# Patient Record
Sex: Female | Born: 1935 | Race: White | Hispanic: No | Smoking: Former smoker
Health system: Southern US, Community
[De-identification: ages and names within clinical notes are randomized; demographics above are authoritative.]

## PROBLEM LIST (undated history)

## (undated) DIAGNOSIS — M810 Age-related osteoporosis without current pathological fracture: Secondary | ICD-10-CM

## (undated) DIAGNOSIS — I639 Cerebral infarction, unspecified: Secondary | ICD-10-CM

## (undated) DIAGNOSIS — N183 Chronic kidney disease, stage 3 unspecified: Secondary | ICD-10-CM

## (undated) DIAGNOSIS — Z9981 Dependence on supplemental oxygen: Secondary | ICD-10-CM

## (undated) DIAGNOSIS — R279 Unspecified lack of coordination: Secondary | ICD-10-CM

## (undated) DIAGNOSIS — Z9289 Personal history of other medical treatment: Secondary | ICD-10-CM

## (undated) DIAGNOSIS — R269 Unspecified abnormalities of gait and mobility: Secondary | ICD-10-CM

## (undated) DIAGNOSIS — I4891 Unspecified atrial fibrillation: Secondary | ICD-10-CM

## (undated) DIAGNOSIS — I739 Peripheral vascular disease, unspecified: Secondary | ICD-10-CM

## (undated) DIAGNOSIS — R296 Repeated falls: Secondary | ICD-10-CM

## (undated) DIAGNOSIS — F32A Depression, unspecified: Secondary | ICD-10-CM

## (undated) DIAGNOSIS — Z66 Do not resuscitate: Secondary | ICD-10-CM

## (undated) DIAGNOSIS — K635 Polyp of colon: Secondary | ICD-10-CM

## (undated) DIAGNOSIS — I509 Heart failure, unspecified: Secondary | ICD-10-CM

## (undated) DIAGNOSIS — Z8673 Personal history of transient ischemic attack (TIA), and cerebral infarction without residual deficits: Secondary | ICD-10-CM

## (undated) DIAGNOSIS — W19XXXA Unspecified fall, initial encounter: Secondary | ICD-10-CM

## (undated) DIAGNOSIS — K222 Esophageal obstruction: Secondary | ICD-10-CM

## (undated) DIAGNOSIS — M5430 Sciatica, unspecified side: Secondary | ICD-10-CM

## (undated) DIAGNOSIS — IMO0002 Reserved for concepts with insufficient information to code with codable children: Secondary | ICD-10-CM

## (undated) DIAGNOSIS — R262 Difficulty in walking, not elsewhere classified: Secondary | ICD-10-CM

## (undated) DIAGNOSIS — M5417 Radiculopathy, lumbosacral region: Secondary | ICD-10-CM

## (undated) DIAGNOSIS — J449 Chronic obstructive pulmonary disease, unspecified: Secondary | ICD-10-CM

## (undated) DIAGNOSIS — R627 Adult failure to thrive: Secondary | ICD-10-CM

## (undated) DIAGNOSIS — K219 Gastro-esophageal reflux disease without esophagitis: Secondary | ICD-10-CM

## (undated) DIAGNOSIS — M549 Dorsalgia, unspecified: Secondary | ICD-10-CM

## (undated) DIAGNOSIS — I2510 Atherosclerotic heart disease of native coronary artery without angina pectoris: Secondary | ICD-10-CM

## (undated) DIAGNOSIS — F329 Major depressive disorder, single episode, unspecified: Secondary | ICD-10-CM

## (undated) DIAGNOSIS — S2249XA Multiple fractures of ribs, unspecified side, initial encounter for closed fracture: Secondary | ICD-10-CM

## (undated) DIAGNOSIS — M7650 Patellar tendinitis, unspecified knee: Secondary | ICD-10-CM

## (undated) DIAGNOSIS — M6281 Muscle weakness (generalized): Secondary | ICD-10-CM

## (undated) DIAGNOSIS — I6529 Occlusion and stenosis of unspecified carotid artery: Secondary | ICD-10-CM

## (undated) DIAGNOSIS — K5730 Diverticulosis of large intestine without perforation or abscess without bleeding: Secondary | ICD-10-CM

## (undated) DIAGNOSIS — N390 Urinary tract infection, site not specified: Secondary | ICD-10-CM

## (undated) DIAGNOSIS — M4800 Spinal stenosis, site unspecified: Secondary | ICD-10-CM

## (undated) DIAGNOSIS — E876 Hypokalemia: Secondary | ICD-10-CM

## (undated) DIAGNOSIS — I10 Essential (primary) hypertension: Secondary | ICD-10-CM

## (undated) DIAGNOSIS — M8448XA Pathological fracture, other site, initial encounter for fracture: Secondary | ICD-10-CM

## (undated) DIAGNOSIS — S2239XA Fracture of one rib, unspecified side, initial encounter for closed fracture: Secondary | ICD-10-CM

## (undated) HISTORY — DX: Polyp of colon: K63.5

## (undated) HISTORY — PX: ANGIOPLASTY: SHX39

## (undated) HISTORY — DX: Personal history of transient ischemic attack (TIA), and cerebral infarction without residual deficits: Z86.73

## (undated) HISTORY — DX: Atherosclerotic heart disease of native coronary artery without angina pectoris: I25.10

## (undated) HISTORY — DX: Essential (primary) hypertension: I10

## (undated) HISTORY — DX: Occlusion and stenosis of unspecified carotid artery: I65.29

## (undated) HISTORY — PX: APPENDECTOMY: SHX54

## (undated) HISTORY — DX: Esophageal obstruction: K22.2

## (undated) HISTORY — DX: Peripheral vascular disease, unspecified: I73.9

## (undated) HISTORY — DX: Chronic obstructive pulmonary disease, unspecified: J44.9

## (undated) HISTORY — DX: Chronic kidney disease, stage 3 unspecified: N18.30

## (undated) HISTORY — DX: Chronic kidney disease, stage 3 (moderate): N18.3

## (undated) HISTORY — DX: Do not resuscitate: Z66

## (undated) HISTORY — DX: Personal history of other medical treatment: Z92.89

## (undated) HISTORY — PX: BACK SURGERY: SHX140

## (undated) HISTORY — DX: Heart failure, unspecified: I50.9

## (undated) HISTORY — DX: Diverticulosis of large intestine without perforation or abscess without bleeding: K57.30

## (undated) HISTORY — PX: ABDOMINAL HYSTERECTOMY: SHX81

## (undated) HISTORY — DX: Unspecified atrial fibrillation: I48.91

---

## 1998-04-30 ENCOUNTER — Emergency Department (HOSPITAL_COMMUNITY): Admission: EM | Admit: 1998-04-30 | Discharge: 1998-04-30 | Payer: Self-pay | Admitting: Emergency Medicine

## 1998-05-17 ENCOUNTER — Ambulatory Visit (HOSPITAL_COMMUNITY): Admission: RE | Admit: 1998-05-17 | Discharge: 1998-05-17 | Payer: Self-pay | Admitting: Gastroenterology

## 1998-05-25 ENCOUNTER — Ambulatory Visit (HOSPITAL_COMMUNITY): Admission: RE | Admit: 1998-05-25 | Discharge: 1998-05-25 | Payer: Self-pay | Admitting: Gastroenterology

## 1998-06-12 ENCOUNTER — Emergency Department (HOSPITAL_COMMUNITY): Admission: EM | Admit: 1998-06-12 | Discharge: 1998-06-12 | Payer: Self-pay | Admitting: Emergency Medicine

## 1998-07-05 ENCOUNTER — Ambulatory Visit (HOSPITAL_COMMUNITY): Admission: RE | Admit: 1998-07-05 | Discharge: 1998-07-05 | Payer: Self-pay | Admitting: Neurosurgery

## 1998-07-20 ENCOUNTER — Encounter: Payer: Self-pay | Admitting: Neurosurgery

## 1998-07-20 ENCOUNTER — Ambulatory Visit (HOSPITAL_COMMUNITY): Admission: RE | Admit: 1998-07-20 | Discharge: 1998-07-20 | Payer: Self-pay | Admitting: Neurosurgery

## 1998-08-03 ENCOUNTER — Ambulatory Visit (HOSPITAL_COMMUNITY): Admission: RE | Admit: 1998-08-03 | Discharge: 1998-08-03 | Payer: Self-pay | Admitting: Urology

## 1999-03-24 ENCOUNTER — Inpatient Hospital Stay (HOSPITAL_COMMUNITY): Admission: EM | Admit: 1999-03-24 | Discharge: 1999-03-25 | Payer: Self-pay | Admitting: Emergency Medicine

## 1999-03-24 ENCOUNTER — Encounter: Payer: Self-pay | Admitting: Emergency Medicine

## 1999-09-15 ENCOUNTER — Ambulatory Visit (HOSPITAL_COMMUNITY): Admission: RE | Admit: 1999-09-15 | Discharge: 1999-09-15 | Payer: Self-pay | Admitting: Internal Medicine

## 1999-09-15 ENCOUNTER — Encounter (INDEPENDENT_AMBULATORY_CARE_PROVIDER_SITE_OTHER): Payer: Self-pay | Admitting: Specialist

## 1999-09-22 ENCOUNTER — Encounter: Payer: Self-pay | Admitting: Internal Medicine

## 1999-09-22 ENCOUNTER — Ambulatory Visit (HOSPITAL_COMMUNITY): Admission: RE | Admit: 1999-09-22 | Discharge: 1999-09-22 | Payer: Self-pay | Admitting: Internal Medicine

## 1999-11-11 ENCOUNTER — Ambulatory Visit (HOSPITAL_COMMUNITY): Admission: RE | Admit: 1999-11-11 | Discharge: 1999-11-11 | Payer: Self-pay | Admitting: Pulmonary Disease

## 2000-01-30 ENCOUNTER — Other Ambulatory Visit: Admission: RE | Admit: 2000-01-30 | Discharge: 2000-01-30 | Payer: Self-pay | Admitting: Family Medicine

## 2000-03-01 ENCOUNTER — Ambulatory Visit: Admission: RE | Admit: 2000-03-01 | Discharge: 2000-03-01 | Payer: Self-pay | Admitting: Pulmonary Disease

## 2000-04-03 ENCOUNTER — Encounter (HOSPITAL_COMMUNITY): Admission: RE | Admit: 2000-04-03 | Discharge: 2000-07-02 | Payer: Self-pay | Admitting: Pulmonary Disease

## 2000-10-24 ENCOUNTER — Encounter: Payer: Self-pay | Admitting: Emergency Medicine

## 2000-10-24 ENCOUNTER — Inpatient Hospital Stay (HOSPITAL_COMMUNITY): Admission: EM | Admit: 2000-10-24 | Discharge: 2000-10-27 | Payer: Self-pay | Admitting: Emergency Medicine

## 2001-03-21 ENCOUNTER — Other Ambulatory Visit: Admission: RE | Admit: 2001-03-21 | Discharge: 2001-03-21 | Payer: Self-pay | Admitting: Family Medicine

## 2001-10-08 ENCOUNTER — Encounter: Admission: RE | Admit: 2001-10-08 | Discharge: 2001-10-08 | Payer: Self-pay | Admitting: *Deleted

## 2002-08-19 ENCOUNTER — Observation Stay (HOSPITAL_COMMUNITY): Admission: RE | Admit: 2002-08-19 | Discharge: 2002-08-20 | Payer: Self-pay | Admitting: Urology

## 2002-11-25 ENCOUNTER — Emergency Department (HOSPITAL_COMMUNITY): Admission: EM | Admit: 2002-11-25 | Discharge: 2002-11-25 | Payer: Self-pay | Admitting: Emergency Medicine

## 2002-12-25 ENCOUNTER — Encounter: Payer: Self-pay | Admitting: Emergency Medicine

## 2002-12-25 ENCOUNTER — Inpatient Hospital Stay (HOSPITAL_COMMUNITY): Admission: EM | Admit: 2002-12-25 | Discharge: 2003-01-01 | Payer: Self-pay | Admitting: Emergency Medicine

## 2002-12-27 ENCOUNTER — Encounter: Payer: Self-pay | Admitting: Cardiology

## 2002-12-31 ENCOUNTER — Encounter: Payer: Self-pay | Admitting: Cardiology

## 2004-03-18 ENCOUNTER — Encounter: Admission: RE | Admit: 2004-03-18 | Discharge: 2004-03-18 | Payer: Self-pay | Admitting: Orthopedic Surgery

## 2004-03-21 ENCOUNTER — Ambulatory Visit (HOSPITAL_COMMUNITY): Admission: RE | Admit: 2004-03-21 | Discharge: 2004-03-21 | Payer: Self-pay | Admitting: Orthopedic Surgery

## 2004-03-21 ENCOUNTER — Ambulatory Visit (HOSPITAL_BASED_OUTPATIENT_CLINIC_OR_DEPARTMENT_OTHER): Admission: RE | Admit: 2004-03-21 | Discharge: 2004-03-21 | Payer: Self-pay | Admitting: Orthopedic Surgery

## 2004-04-19 ENCOUNTER — Ambulatory Visit (HOSPITAL_COMMUNITY): Admission: RE | Admit: 2004-04-19 | Discharge: 2004-04-19 | Payer: Self-pay | Admitting: Internal Medicine

## 2004-06-25 ENCOUNTER — Inpatient Hospital Stay (HOSPITAL_COMMUNITY): Admission: EM | Admit: 2004-06-25 | Discharge: 2004-06-28 | Payer: Self-pay | Admitting: Emergency Medicine

## 2004-09-11 ENCOUNTER — Emergency Department (HOSPITAL_COMMUNITY): Admission: EM | Admit: 2004-09-11 | Discharge: 2004-09-11 | Payer: Self-pay | Admitting: Emergency Medicine

## 2004-10-03 ENCOUNTER — Emergency Department (HOSPITAL_COMMUNITY): Admission: EM | Admit: 2004-10-03 | Discharge: 2004-10-03 | Payer: Self-pay

## 2004-10-07 ENCOUNTER — Ambulatory Visit: Payer: Self-pay | Admitting: Cardiology

## 2004-10-07 ENCOUNTER — Ambulatory Visit: Payer: Self-pay | Admitting: Internal Medicine

## 2004-10-07 ENCOUNTER — Inpatient Hospital Stay (HOSPITAL_COMMUNITY): Admission: EM | Admit: 2004-10-07 | Discharge: 2004-10-15 | Payer: Self-pay | Admitting: Emergency Medicine

## 2004-10-10 ENCOUNTER — Encounter: Payer: Self-pay | Admitting: Cardiology

## 2004-10-13 ENCOUNTER — Ambulatory Visit: Payer: Self-pay | Admitting: Internal Medicine

## 2004-12-20 ENCOUNTER — Ambulatory Visit: Payer: Self-pay | Admitting: Family Medicine

## 2005-01-12 ENCOUNTER — Ambulatory Visit: Payer: Self-pay | Admitting: Family Medicine

## 2005-01-31 ENCOUNTER — Ambulatory Visit: Payer: Self-pay | Admitting: Family Medicine

## 2005-04-04 ENCOUNTER — Ambulatory Visit: Payer: Self-pay | Admitting: Family Medicine

## 2005-04-05 IMAGING — CR DG ELBOW COMPLETE 3+V*L*
3 series · 3 of 3 positions shown · non-contrast
Comparison: None.

CLINICAL DATA: Fall with pain.  
 LEFT ELBOW 10/03/04:

[view not recorded (1 of 3)]
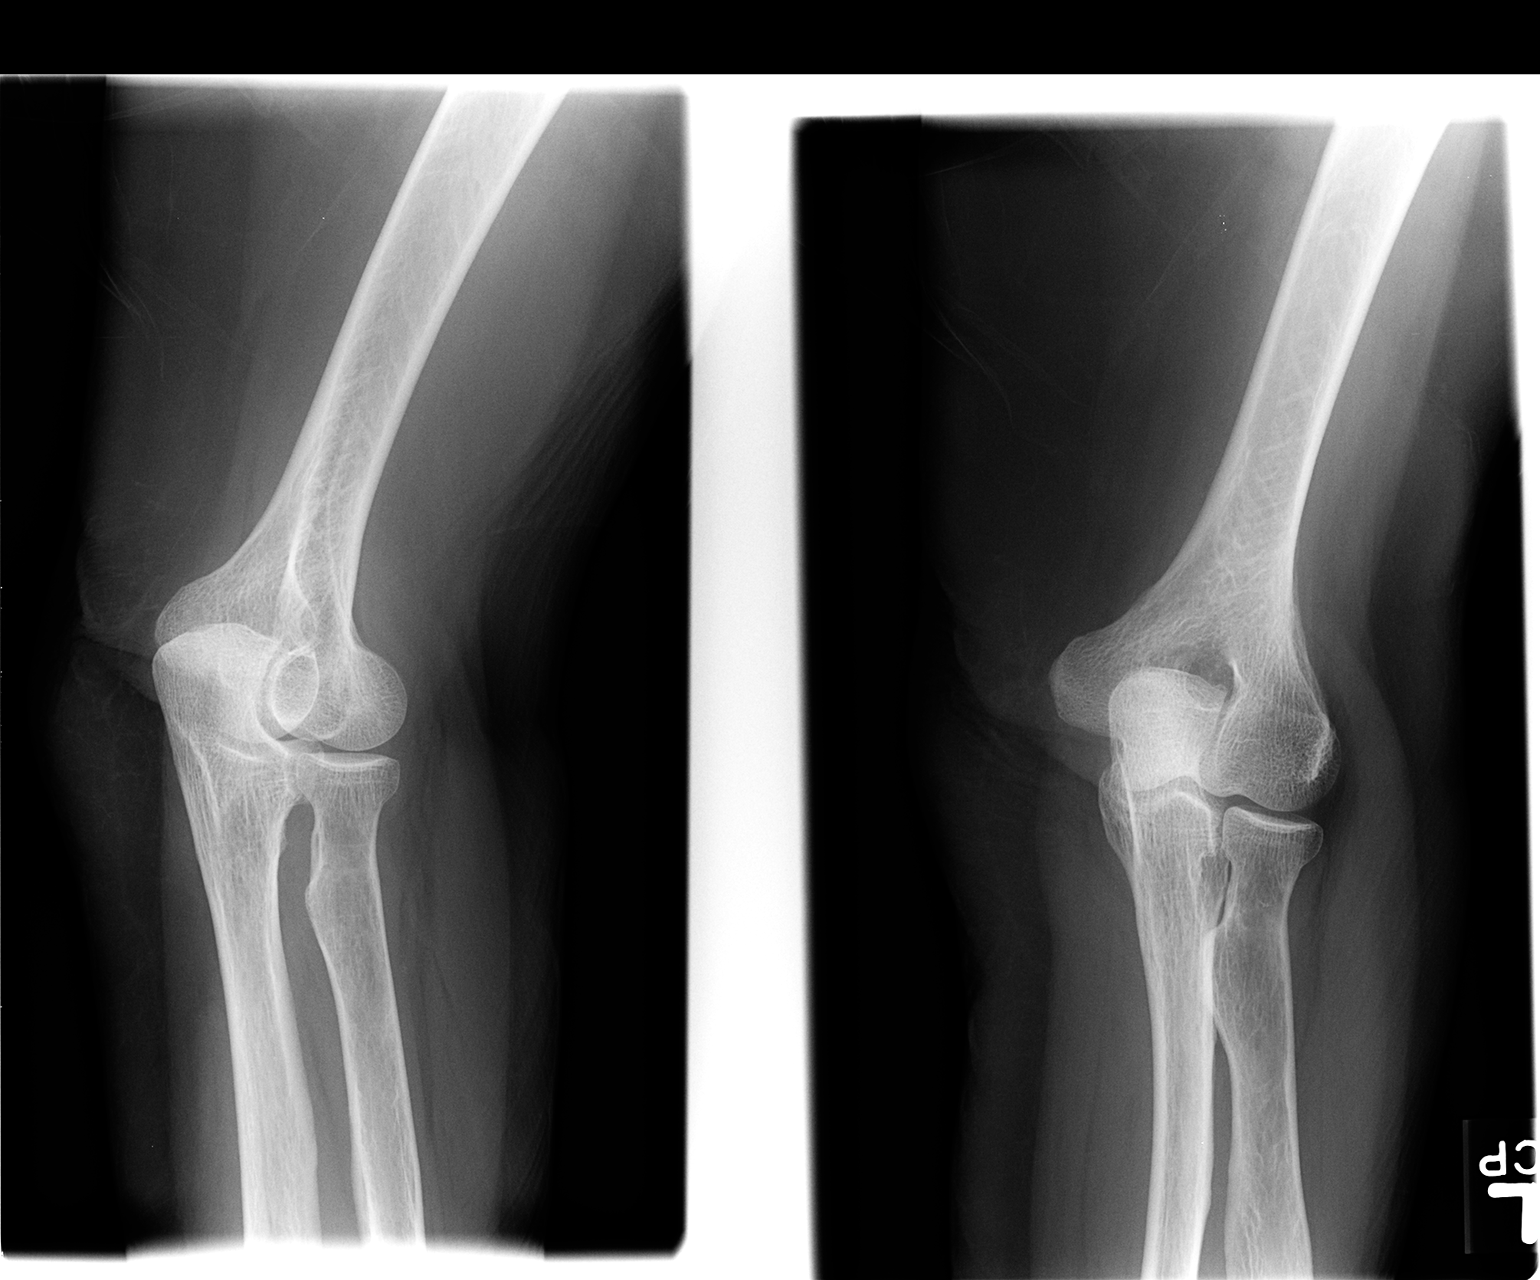

[view not recorded (2 of 3)]
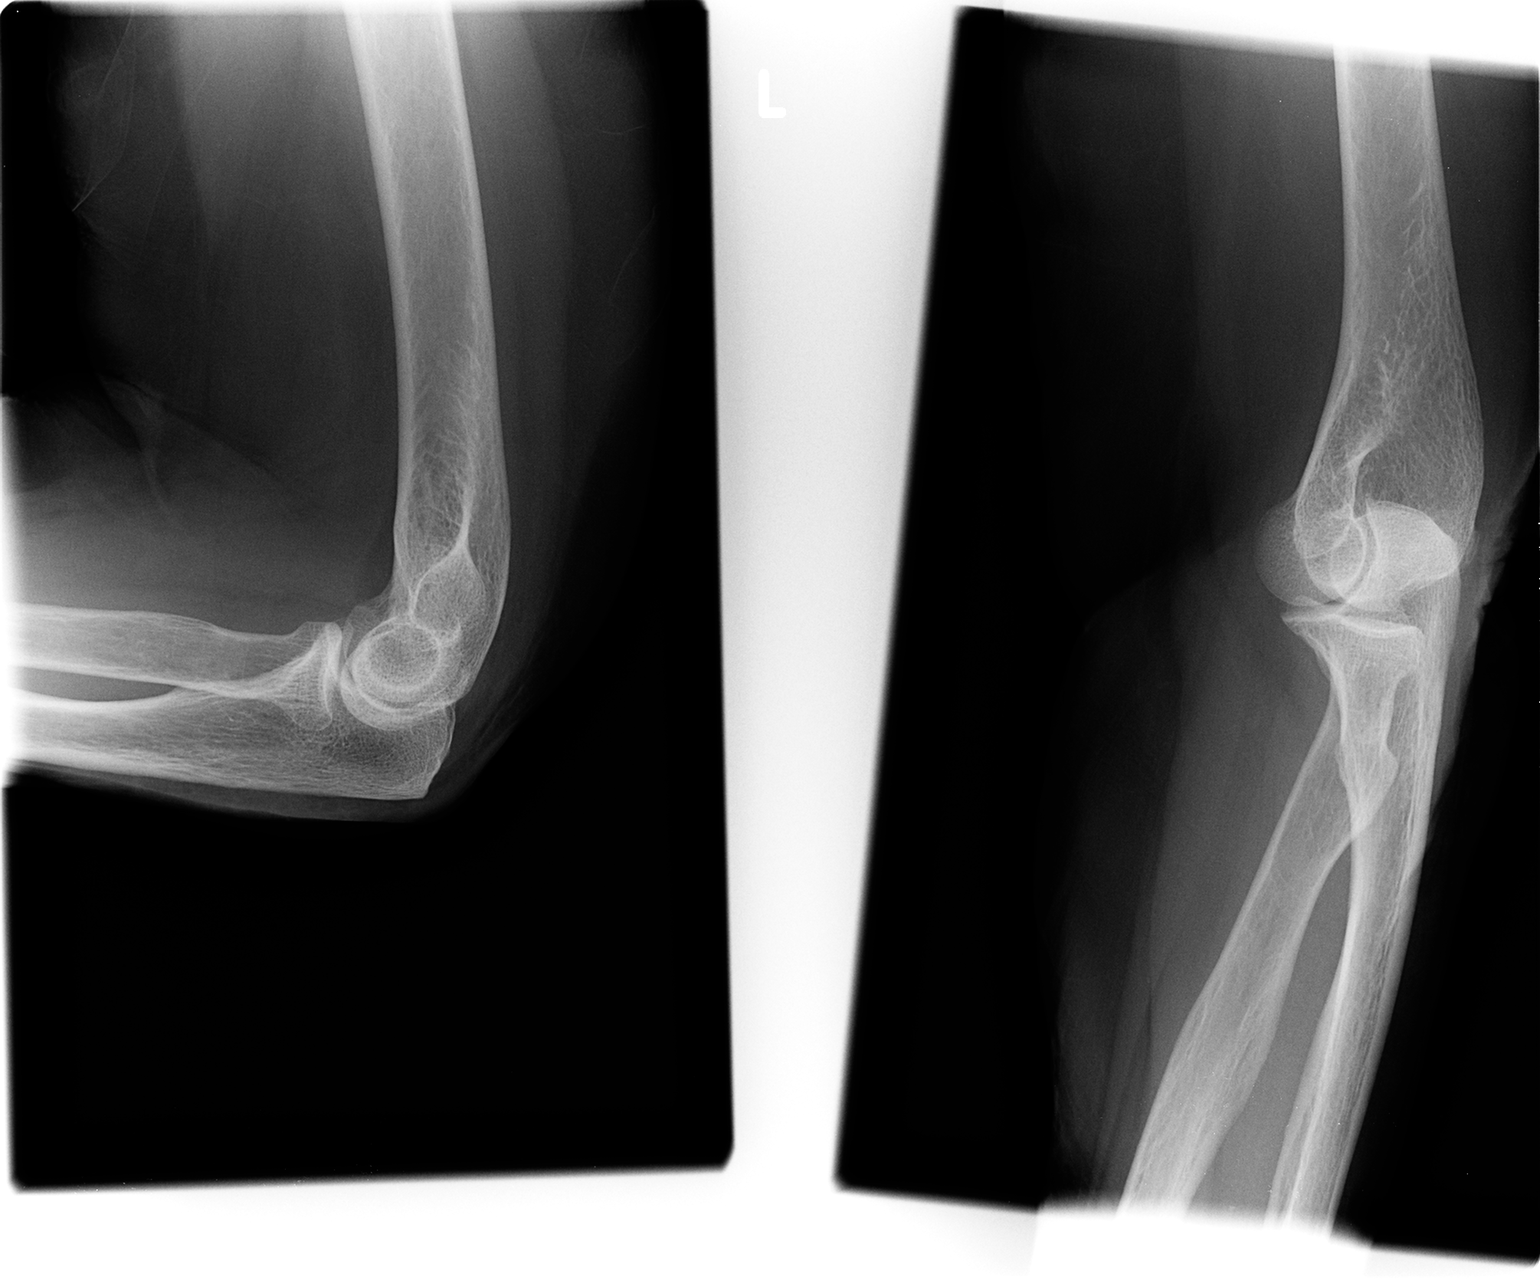

[view not recorded (3 of 3)]
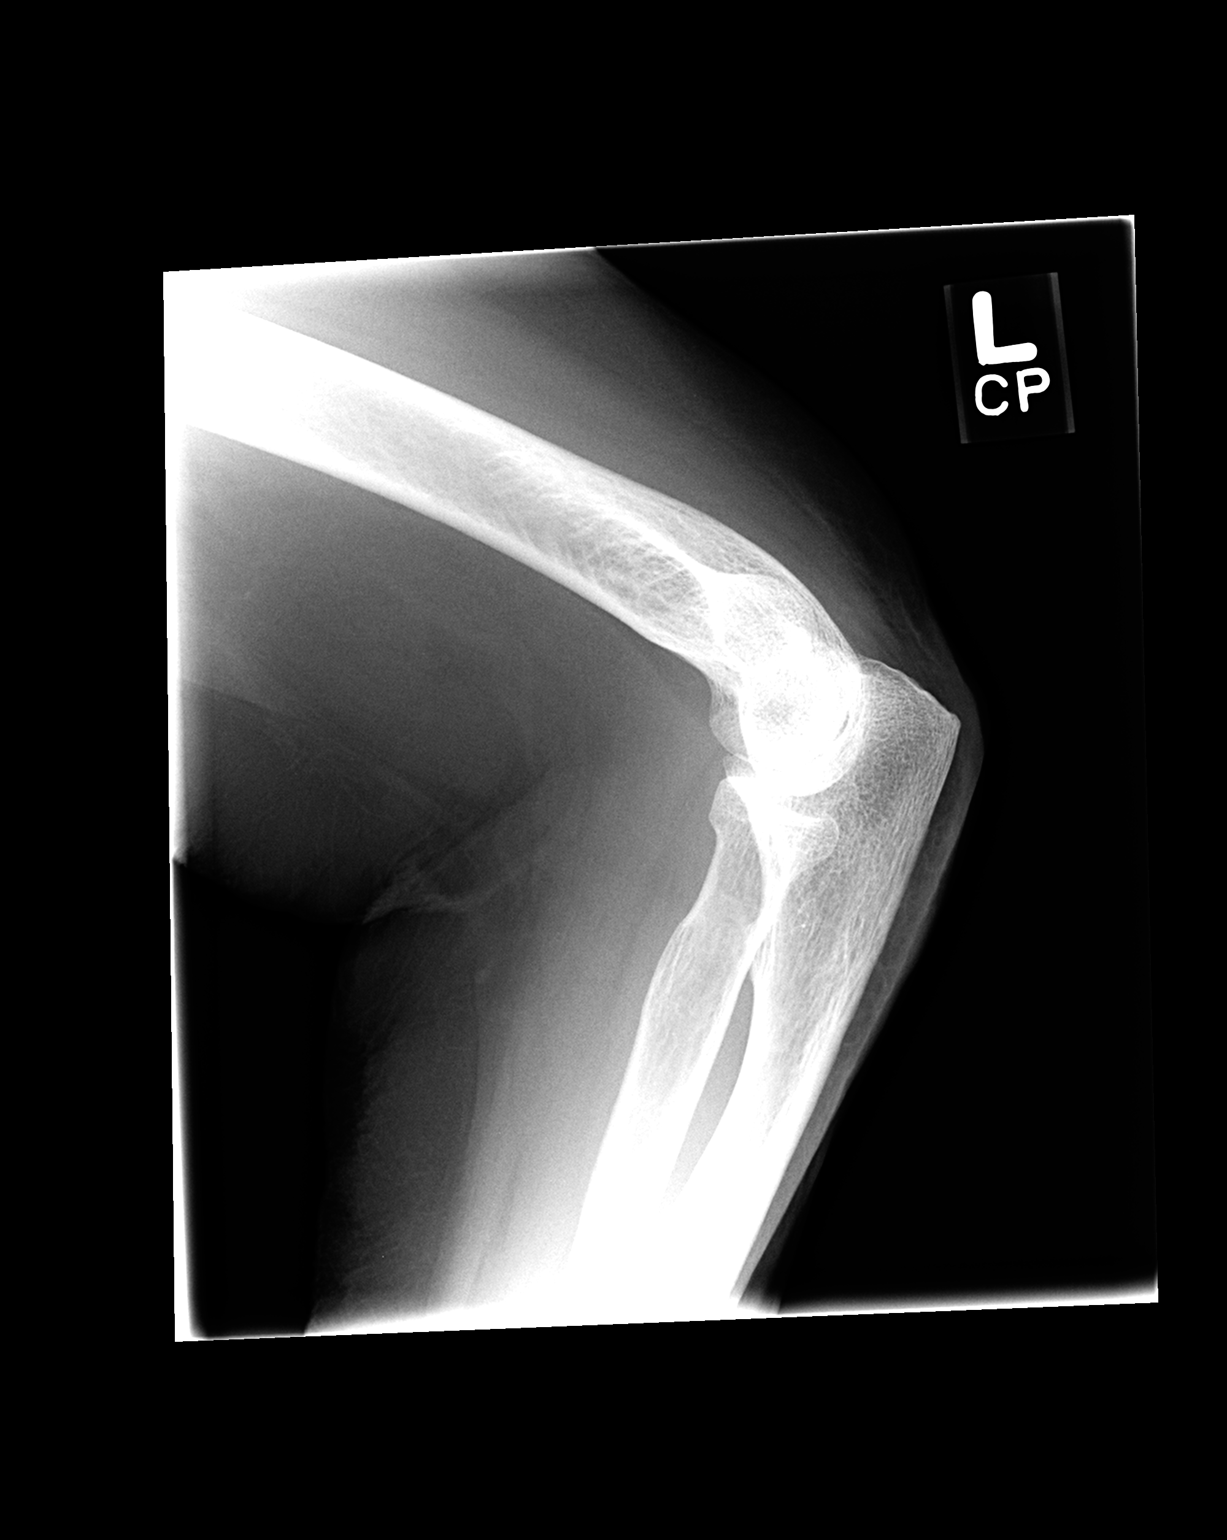

[3 of 3 positions shown; findings below may reference images not displayed]

FINDINGS: Four view exam left elbow shows no acute fracture or dislocation.  There is no elevation of either fat pad.  The overlying soft tissues are unremarkable.
IMPRESSION: No acute bony abnormality.

## 2005-04-09 IMAGING — CR DG THORACIC SPINE 2V
3 series · 3 of 3 positions shown · non-contrast
Comparison: None.
COMPARISON: None.

CLINICAL DATA: Fell, pain in back, pelvis and right hip.  
 THORACIC SPINE ? TWO VIEWS:

[view not recorded (1 of 3)]
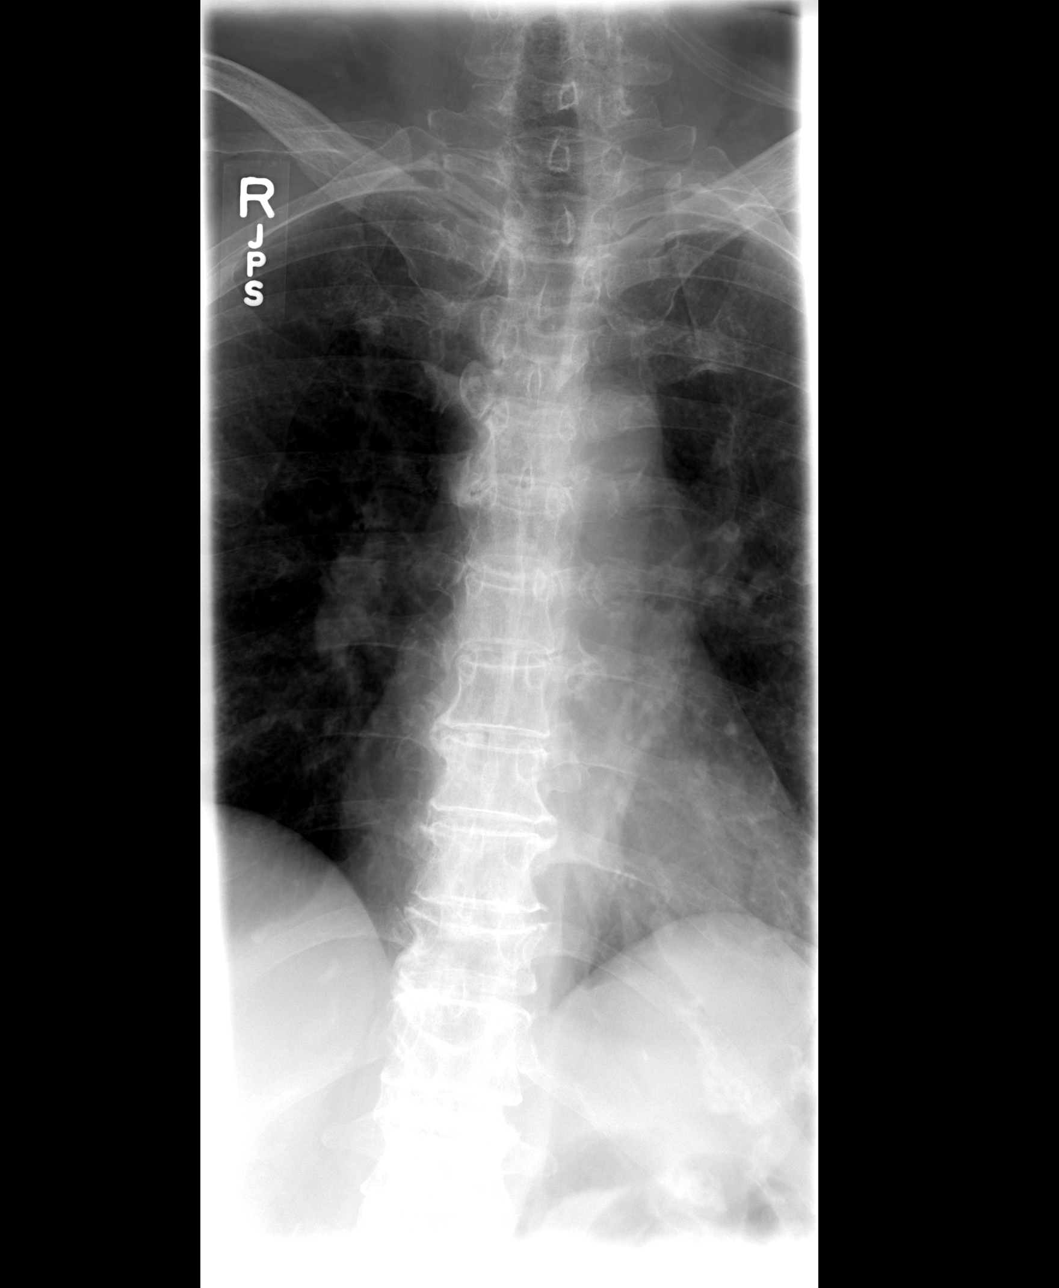

[view not recorded (2 of 3)]
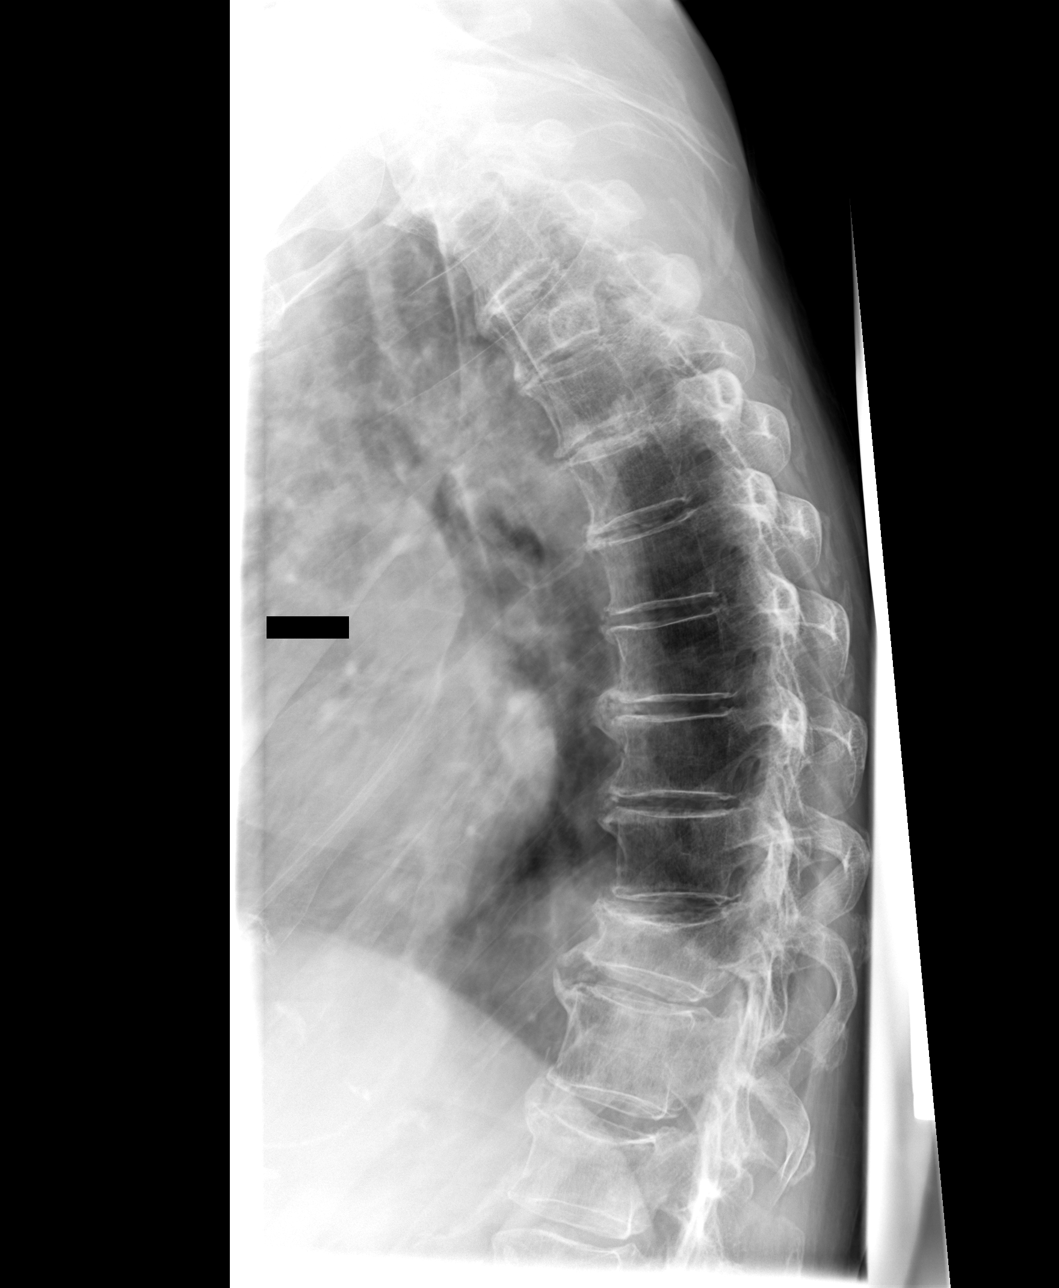

[view not recorded (3 of 3)]
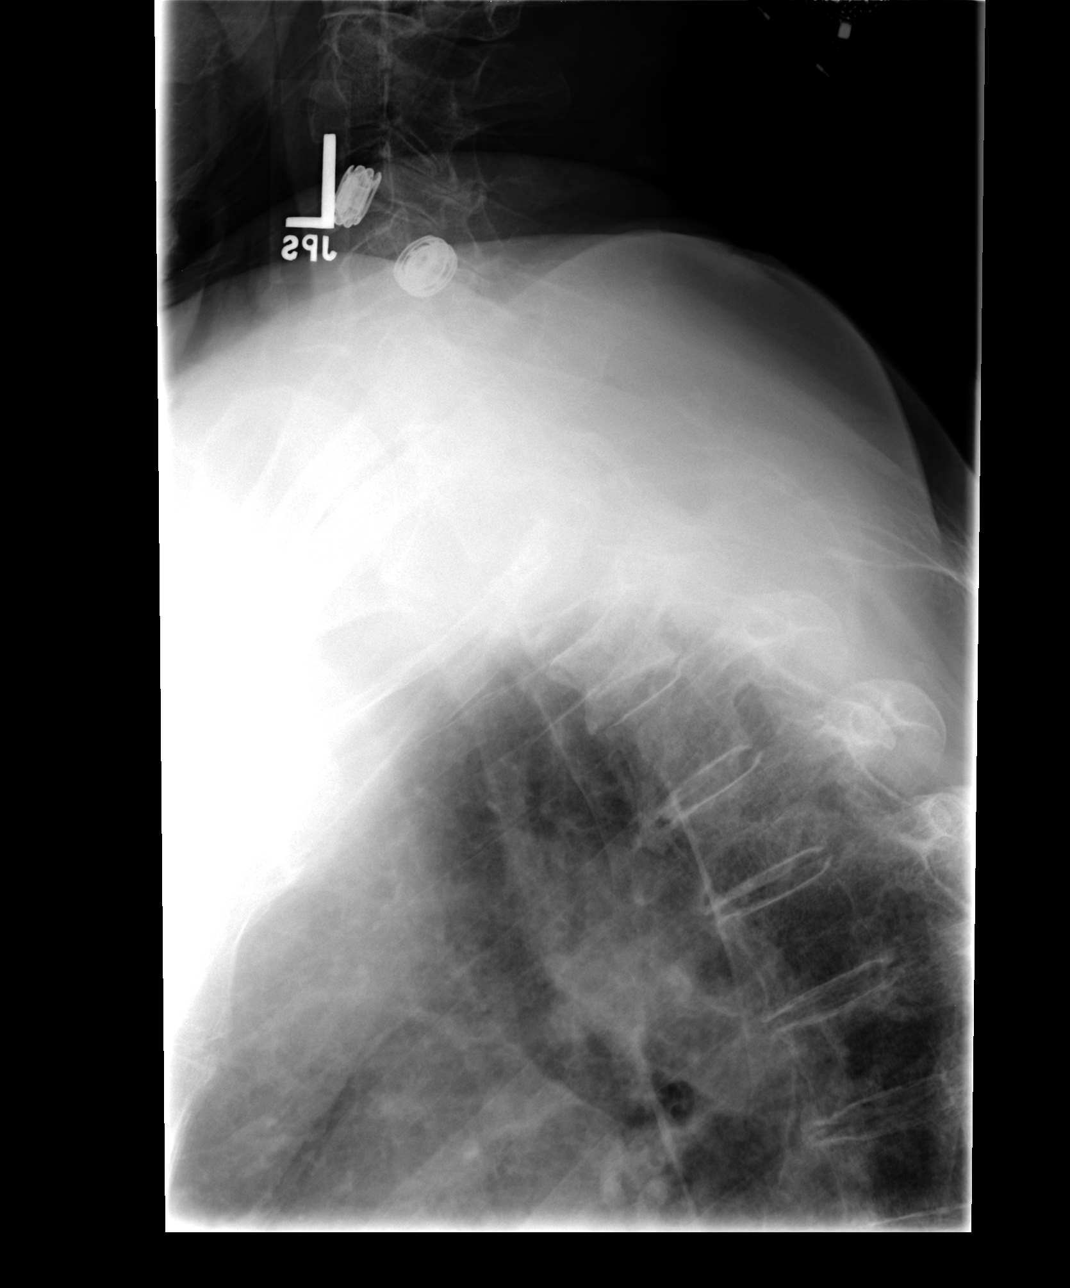

[3 of 3 positions shown; findings below may reference images not displayed]

FINDINGS: Two view exam shows a compression fracture of T11.  There are no prior thoracic spine films, but there are prior chest x-rays.  The T11 fracture was present on prior films.  The last one that I have for review is from February 2004.  No definite acute abnormality.  
 There are moderate degenerative changes.
IMPRESSION: T11 compression fracture ? not acute.  No definite acute abnormality seen.  
 LUMBAR SPINE COMPLETE:
FINDINGS: Advanced multilevel degenerative disc disease most notably at L2-3 and L4-5.  No definite acute changes.  Prior laminectomy at L2-3.
IMPRESSION: Degenerative and postoperative changes ? no acute abnormality.  
 PELVIS ONE VIEW:
 No acute changes.  Postoperative deformity of the left iliac crest.  Degenerative changes of the SI joints and symphysis pubis.
IMPRESSION: Degenerative and postoperative changes ? no acute abnormality.

## 2005-04-09 IMAGING — CR DG PELVIS 1-2V
1 series · 1 of 1 positions shown · non-contrast
Comparison: None.
COMPARISON: None.

CLINICAL DATA: Fell, pain in back, pelvis and right hip.  
 THORACIC SPINE ? TWO VIEWS:

[view not recorded]
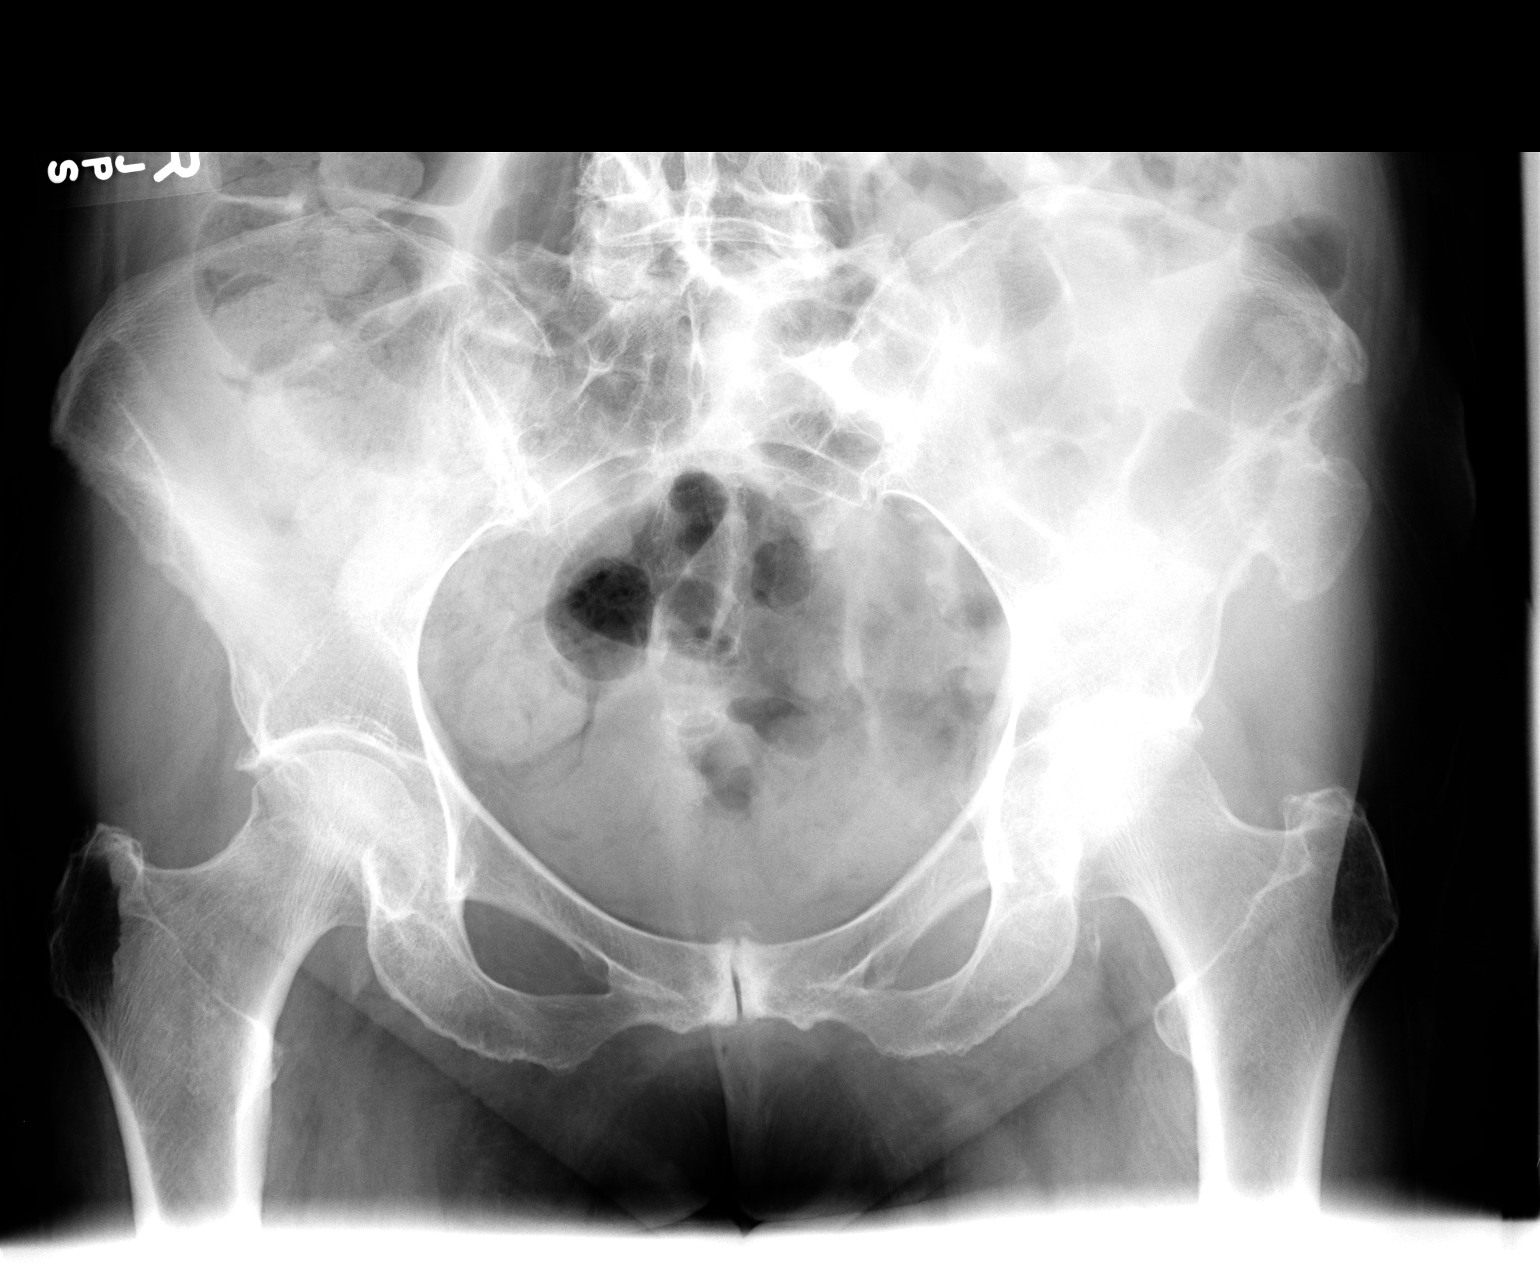

[1 of 1 positions shown; findings below may reference images not displayed]

FINDINGS: Two view exam shows a compression fracture of T11.  There are no prior thoracic spine films, but there are prior chest x-rays.  The T11 fracture was present on prior films.  The last one that I have for review is from February 2004.  No definite acute abnormality.  
 There are moderate degenerative changes.
IMPRESSION: T11 compression fracture ? not acute.  No definite acute abnormality seen.  
 LUMBAR SPINE COMPLETE:
FINDINGS: Advanced multilevel degenerative disc disease most notably at L2-3 and L4-5.  No definite acute changes.  Prior laminectomy at L2-3.
IMPRESSION: Degenerative and postoperative changes ? no acute abnormality.  
 PELVIS ONE VIEW:
 No acute changes.  Postoperative deformity of the left iliac crest.  Degenerative changes of the SI joints and symphysis pubis.
IMPRESSION: Degenerative and postoperative changes ? no acute abnormality.

## 2005-05-01 ENCOUNTER — Ambulatory Visit: Payer: Self-pay | Admitting: Internal Medicine

## 2005-05-11 ENCOUNTER — Ambulatory Visit: Payer: Self-pay | Admitting: Family Medicine

## 2005-06-01 ENCOUNTER — Ambulatory Visit (HOSPITAL_COMMUNITY): Admission: RE | Admit: 2005-06-01 | Discharge: 2005-06-01 | Payer: Self-pay | Admitting: Internal Medicine

## 2005-06-01 ENCOUNTER — Ambulatory Visit: Payer: Self-pay | Admitting: Internal Medicine

## 2005-06-01 ENCOUNTER — Encounter (INDEPENDENT_AMBULATORY_CARE_PROVIDER_SITE_OTHER): Payer: Self-pay | Admitting: Specialist

## 2005-06-01 DIAGNOSIS — K5730 Diverticulosis of large intestine without perforation or abscess without bleeding: Secondary | ICD-10-CM

## 2005-06-01 DIAGNOSIS — K635 Polyp of colon: Secondary | ICD-10-CM

## 2005-06-01 HISTORY — DX: Diverticulosis of large intestine without perforation or abscess without bleeding: K57.30

## 2005-06-01 HISTORY — DX: Polyp of colon: K63.5

## 2005-06-28 ENCOUNTER — Ambulatory Visit: Payer: Self-pay | Admitting: Family Medicine

## 2005-07-04 ENCOUNTER — Encounter: Admission: RE | Admit: 2005-07-04 | Discharge: 2005-07-04 | Payer: Self-pay | Admitting: Family Medicine

## 2005-09-20 ENCOUNTER — Ambulatory Visit: Payer: Self-pay | Admitting: Family Medicine

## 2005-11-02 ENCOUNTER — Ambulatory Visit: Payer: Self-pay | Admitting: Family Medicine

## 2005-11-05 ENCOUNTER — Emergency Department (HOSPITAL_COMMUNITY): Admission: EM | Admit: 2005-11-05 | Discharge: 2005-11-05 | Payer: Self-pay | Admitting: Emergency Medicine

## 2005-12-09 ENCOUNTER — Inpatient Hospital Stay (HOSPITAL_COMMUNITY): Admission: EM | Admit: 2005-12-09 | Discharge: 2005-12-12 | Payer: Self-pay | Admitting: Emergency Medicine

## 2005-12-11 ENCOUNTER — Ambulatory Visit: Payer: Self-pay | Admitting: Internal Medicine

## 2005-12-14 ENCOUNTER — Ambulatory Visit: Payer: Self-pay | Admitting: Family Medicine

## 2005-12-15 ENCOUNTER — Ambulatory Visit: Payer: Self-pay | Admitting: Cardiology

## 2006-02-06 ENCOUNTER — Ambulatory Visit: Payer: Self-pay | Admitting: Family Medicine

## 2006-02-20 ENCOUNTER — Ambulatory Visit: Payer: Self-pay | Admitting: Family Medicine

## 2006-04-19 ENCOUNTER — Ambulatory Visit: Payer: Self-pay | Admitting: Family Medicine

## 2006-06-05 ENCOUNTER — Ambulatory Visit: Payer: Self-pay | Admitting: Family Medicine

## 2006-06-08 ENCOUNTER — Encounter: Admission: RE | Admit: 2006-06-08 | Discharge: 2006-06-08 | Payer: Self-pay | Admitting: Family Medicine

## 2006-08-14 ENCOUNTER — Ambulatory Visit: Payer: Self-pay | Admitting: Family Medicine

## 2006-09-04 ENCOUNTER — Ambulatory Visit: Payer: Self-pay | Admitting: Family Medicine

## 2006-09-11 ENCOUNTER — Ambulatory Visit: Payer: Self-pay | Admitting: Family Medicine

## 2006-10-01 ENCOUNTER — Ambulatory Visit: Payer: Self-pay | Admitting: Internal Medicine

## 2006-10-05 ENCOUNTER — Ambulatory Visit: Payer: Self-pay | Admitting: Internal Medicine

## 2006-11-08 ENCOUNTER — Ambulatory Visit: Payer: Self-pay | Admitting: Family Medicine

## 2006-12-06 ENCOUNTER — Ambulatory Visit: Payer: Self-pay | Admitting: Cardiology

## 2006-12-24 ENCOUNTER — Inpatient Hospital Stay (HOSPITAL_COMMUNITY): Admission: EM | Admit: 2006-12-24 | Discharge: 2006-12-31 | Payer: Self-pay | Admitting: Emergency Medicine

## 2006-12-25 ENCOUNTER — Ambulatory Visit: Payer: Self-pay | Admitting: Internal Medicine

## 2007-02-01 ENCOUNTER — Encounter: Admission: RE | Admit: 2007-02-01 | Discharge: 2007-02-01 | Payer: Self-pay | Admitting: Internal Medicine

## 2007-04-10 ENCOUNTER — Emergency Department (HOSPITAL_COMMUNITY): Admission: EM | Admit: 2007-04-10 | Discharge: 2007-04-10 | Payer: Self-pay | Admitting: Emergency Medicine

## 2007-06-07 ENCOUNTER — Emergency Department (HOSPITAL_COMMUNITY): Admission: EM | Admit: 2007-06-07 | Discharge: 2007-06-07 | Payer: Self-pay | Admitting: Emergency Medicine

## 2007-12-02 ENCOUNTER — Ambulatory Visit: Payer: Self-pay | Admitting: Cardiology

## 2008-03-02 ENCOUNTER — Inpatient Hospital Stay (HOSPITAL_COMMUNITY): Admission: EM | Admit: 2008-03-02 | Discharge: 2008-03-05 | Payer: Self-pay | Admitting: Emergency Medicine

## 2008-12-15 ENCOUNTER — Ambulatory Visit: Payer: Self-pay | Admitting: Cardiology

## 2009-08-01 DIAGNOSIS — R609 Edema, unspecified: Secondary | ICD-10-CM

## 2009-08-01 DIAGNOSIS — Z8679 Personal history of other diseases of the circulatory system: Secondary | ICD-10-CM | POA: Insufficient documentation

## 2009-08-01 DIAGNOSIS — I679 Cerebrovascular disease, unspecified: Secondary | ICD-10-CM

## 2009-08-01 DIAGNOSIS — I10 Essential (primary) hypertension: Secondary | ICD-10-CM | POA: Insufficient documentation

## 2009-08-01 DIAGNOSIS — F17200 Nicotine dependence, unspecified, uncomplicated: Secondary | ICD-10-CM

## 2009-08-01 DIAGNOSIS — I739 Peripheral vascular disease, unspecified: Secondary | ICD-10-CM

## 2009-08-06 ENCOUNTER — Ambulatory Visit: Payer: Self-pay | Admitting: Cardiology

## 2009-08-06 DIAGNOSIS — R0789 Other chest pain: Secondary | ICD-10-CM | POA: Insufficient documentation

## 2009-10-01 ENCOUNTER — Encounter (INDEPENDENT_AMBULATORY_CARE_PROVIDER_SITE_OTHER): Payer: Self-pay | Admitting: *Deleted

## 2010-07-15 ENCOUNTER — Encounter: Admission: RE | Admit: 2010-07-15 | Discharge: 2010-07-15 | Payer: Self-pay | Admitting: Internal Medicine

## 2010-12-18 ENCOUNTER — Encounter: Payer: Self-pay | Admitting: Family Medicine

## 2010-12-25 LAB — CONVERTED CEMR LAB
ALT: 21 units/L (ref 0–40)
AST: 30 units/L (ref 0–37)
Albumin: 3.9 g/dL (ref 3.5–5.2)
CO2: 28 meq/L (ref 19–32)
Calcium: 9.3 mg/dL (ref 8.4–10.5)
Chol/HDL Ratio, serum: 4.1
Cholesterol: 131 mg/dL (ref 0–200)
Glucose, Bld: 102 mg/dL — ABNORMAL HIGH (ref 70–99)
HCT: 36.5 % (ref 36.0–46.0)
HDL: 31.7 mg/dL — ABNORMAL LOW (ref >39.0)
MCHC: 34 g/dL (ref 30.0–36.0)
Platelets: 255 10*3/uL (ref 150–400)
Potassium: 3.6 meq/L (ref 3.5–5.1)
RBC: 3.39 M/uL — ABNORMAL LOW (ref 3.87–5.11)
RDW: 12 % (ref 11.5–14.6)
TSH: 0.95 microintl units/mL (ref 0.35–5.50)
VLDL: 47 mg/dL — ABNORMAL HIGH (ref 0–40)

## 2011-04-11 NOTE — Discharge Summary (Signed)
NAMESHELAGH, RAYMAN              ACCOUNT NO.:  [id number]   MEDICAL RECORD NO.:  [id number]          PATIENT TYPE:  INP   LOCATION:  1515                         FACILITY:  [facility]   PHYSICIAN:  Geoffry Paradise, M.D.  DATE OF BIRTH:  [birth year]   DATE OF ADMISSION:  03/02/2008  DATE OF DISCHARGE:  03/05/2008                               DISCHARGE SUMMARY   DIAGNOSIS AT TIME OF DISCHARGE:  1. Acute fracture left seventh and ninth ribs.  2. Remote compression fractures T-11 and L1.  3. Depression.  4. Gastroesophageal reflux disease.  5. Chronic gait disorder with multiple falls.  6. Hypothyroidism.  7. Paroxysmal atrial fibrillation, not a candidate for Coumadin      secondary to falls.  8. Hypertension.   HISTORY OF PRESENT ILLNESS:  Ms. Guiffre is a pleasant 77 year old with  osteoporosis and known compression fractures, hypertension, paroxysmal  atrial fibrillation, gastroesophageal reflux disease, COPD, ASCAD/PTA  presenting, at this time, with fall and intractable pain.  She was a  resident at the [facility] and [facility] for some  time, but progressed in rehab to the extent that she was independent and  desired discharge back to independent living.  Even at the facility she  was prone to multiple falls, and has a chronic gait disorder, largely  secondary to degenerative disk disease, spinal stenosis, and chronic  back issues with myelopathy; all of which has been addressed by Dr.  Jeral Fruit in the past.   She has done reasonably well at home; but continues to have falls and  presents, at this time, with recurrent fall against the window sill with  intractable rib pain.  She was admitted for pain control and hydration.  For details, see the dictated summary by my partner, Dr. Timothy Lasso.   DATA:  CT of the pelvis--no abnormal findings.  Chest x-ray revealed old  compression fractures, new fracture left seventh and ninth ribs with  multiple old rib fractures.   No underlying pulmonary disease.  CBC was  hemoglobin 9.6, hematocrit 27.3, platelet count was 197.  Sodium 137,  potassium 3.6, chloride 24, CO2 27, glucose 109, BUN 17, creatinine 1.2,  calcium 8.8.   HOSPITAL COURSE:  The patient was admitted, aggressively hydrated.  Duragesic patch dose increased from baseline, and supplemented with IV  morphine.  Pulmonary toilet was initiated.  She remained in significant  pain for the first 48 hours, but following a transition that included  oral Vicodin she is more comfortable, a bit more mobile and p.o. intake  has improved as well.  She has had some bilateral heel irritation which  started in the hospital as well, largely secondary to immobility and  will be transitioned back to [facility]  for attempts at rehab, continued pain control, nutritional support and  mobilization.  She certainly should be able to achieve a higher level of  functioning, once again, once she has recovered from these rib  fractures.   DISCHARGE MEDICATIONS:  1. Os-Cal D 1 b.i.d.  2. Restasis eye drops 0.05% one drop each eye b.i.d.  3.  Reglan 5 mg p.o. q.i.d.  4. Senna S 2 p.o. nightly.  5. Lovastatin 20 mg p.o. daily.  6. Travatan I 0.004% one drop each eye nightly.  7. Prilosec 20 mg p.o. daily.  8. Duragesic patch is now 75 mcg q.72 h.  9. Boniva 150 one p.o. monthly.  10.Celexa 40 mg daily.  11.Norvasc 5 mg daily.  12.KCl 20 mEq daily.  13.Synthroid 88 mcg daily.  14.Aspirin 81 mg daily.  15.Lasix 40 p.o. daily.  16.Inderal LA 80 mg p.o. daily.  17  Tylenol 650 p.o. q.4 h. Pain.  1. Vicodin 5/500 one to two p.o. q.4 h. p.r.n. pain.  2. Atrovent inhaler 2 puffs q.i.d.  3. Albuterol nebulizer 2.5 b.i.d. and q.4 h. p.r.n. increasing      shortness of breath.  4. MiraLax 17 gm daily.   DIET:  Regular diet.   CONSULTS:  OT/PT.           ______________________________  Geoffry Paradise, M.D.     RA/MEDQ  D:   03/05/2008  T:  03/05/2008  Job:  [id number]

## 2011-04-11 NOTE — Assessment & Plan Note (Signed)
[facility]                            CARDIOLOGY OFFICE NOTE   NAME:[name], [name]                     MRN:          [id number]  DATE:12/02/2007                            DOB:          1922/03/05    [name] returns today for further management of following issues:  1. Coronary artery disease.  She is having no angina.  She remains      very inactive.  She is living alone and uses a walker most the      time.  2. Cerebrovascular disease with nonobstructive internal carotid plaque      with antegrade flow in both vertebrals.  She is asymptomatic.  3. Peripheral vascular disease, stable.  4. Hypertension with a history orthostatic hypotension and syncope.  5. Lower extremity edema.  6. History of nonobstructive renal artery stenosis.   She unfortunately has been at [facility] for 9 months out  of the last year.  She has had several falls where she has hurt her  wrist as well as has received stitches in her head and face.   She continues to live alone.   She says she is not smoking, but she smells heavily of tobacco today.   Her current meds cardiac meds are simvastatin 40 mg a day.  She is no  longer on aspirin.   Her blood pressure is 151/59.  Her pulse is 51 and sinus brady.  EKG  shows no change.  Her weight is 134, down three.  HEENT:  unchanged.  Carotid upstrokes were equal bilaterally with soft  systolic sounds particularly on the right.  Thyroid is not enlarged.  Trachea is midline.  LUNGS:  Reveal decreased breath sounds throughout.  There is no rhonchi.  HEART:  Reveals a nondisplaced PMI.  She has normal S1-S2.  ABDOMEN:  Soft, good bowel sounds.  EXTREMITIES:  Reveal no edema.  Pulses are present, but reduced.  NEURO:  Exam is grossly intact.  She looks fairly steady on her walker  leaving the clinic.   ASSESSMENT/PLAN:  Ms. Doffing is stable from our standpoint.  I am very  concerned about her living alone  and frequent falls.  She says that her  daughter checks on her every day and spends most Saturday's with her.  She may need assisted living or a nursing home.   I know that she is on aspirin still 81 mg a day.  She also takes  furosemide 40 mg a day.   I have made no change in medical program.  I will see her back again a  year.     Thomas C. Daleen Squibb, MD, [facility]  Electronically Signed    TCW/MedQ  DD: 12/02/2007  DT: 12/02/2007  Job #: [id number]   cc:   Ellin Saba., MD

## 2011-04-11 NOTE — H&P (Signed)
[name], [name] NO.:  [id number]   MEDICAL RECORD NO.:  [id number]          PATIENT TYPE:  EMS   LOCATION:  ED                           FACILITY:  [facility]   PHYSICIAN:  Gwen Pounds, MD       DATE OF BIRTH:  [birth year]   DATE OF ADMISSION:  03/02/2008  DATE OF DISCHARGE:                              HISTORY & PHYSICAL   PRIMARY CARE PHYSICIAN:  Dr. Cordie Grice.   NEUROSURGEON:  Dr. Jeral Fruit.   CHIEF COMPLAINT:  Rib pain, rib fracture, needs admission for pain  control.   HISTORY OF PRESENT ILLNESS:  An 77 year old female with osteoporosis  with compression fracture, history of wrist fracture and now rib  fracture despite the Boniva.  Recently treated for urinary tract  infection on January 26, 2008.  She was treated with 7 days of Cipro.  She  fell today while holding her walker in trying to get dressed.  She hit a  window ledge on the left side ribcage, had immediate pain, could not get  up.  Had lifeline but decided to crawl to the phone beside her bed and  call her neighbor.  The neighbor came over and they used lifeline to  call EMS.  She was brought to the ED for evaluation and treatment.  She  was diagnosed with left rib fracture, given morphine 2 mg, Zofran 4 mg  with only minimal relief, although she is okay arrest.  She needs  admission for pain control, falls and control for her failure to thrive.   PAST MEDICAL HISTORY:  Include history of right lower lobe lung, nodule  history heel ulcer, history wearing a lumbosacral support for her  vertebral compression fracture.  History of depression.  History of  osteoporosis, constipation, glaucoma, GERD, hysterectomy, 5 back  operations.  History of hypokalemia, known chronic pain secondary  degenerative disc disease and spinal stenosis and compression fractures.  History of congestive heart failure, coronary artery disease, status  post percutaneous intervention, peripheral vascular disease,  peripheral  arterial disease, hyperlipidemia, hypertension, history of left wrist  fracture.  Known gait disorder with history of numerous falls,  hypothyroidism, chronic bronchitis/COPD, chronic back, status cor  pulmonale, multiple urinary tract infections.  History of paroxysmal  atrial fibrillation on Coumadin secondary to fall.  History of  compression fracture and neurogenic bladder with incontinence.  She is  currently off the Vesicare.  History of nonobstructive renal artery  stenosis.   ALLERGIES:  CODEINE, PENICILLIN, METOPROLOL, PREDNISONE.   MEDICATION LIST:  Includes Os-Cal D 500 b.i.d., DuoNebs b.i.d., Mucinex  DM b.i.d., Restasis  stasis 0.05% at b.i.d., Reglan 5 mg q.i.d., Senna S  at bedtime, lovastatin 20 mg daily, Travatan 0.004% at bedtime, Prilosec  20 mg daily, Duragesic patch 50 mcg change q. 3 days, Boniva 150 mg  monthly, Celexa 30 mg per day, Norvasc 5 mg daily, KCl 20 mEq q. day.  Magic Mouthwash as needed.  Vicodin 7.5/500 q.i.d. p.r.n., Tylenol 325-  650 mg q.4 h p.r.n., Inderal LA 80 mg daily, Pletal 100 mg p.o. daily,  Lasix 40  40 mg p.o. daily, aspirin 81 daily Synthroid 18 mcg daily.   FAMILY HISTORY:  Mother died at age 57 of coronary disease.  Father died  at 4 of cancer.   SOCIAL HISTORY:  She has been at [facility] in the past.  No tobacco  greater than 1 year.  Lives alone.   REVIEW OF SYSTEMS:  No chest pain or shortness.  It hurts when she takes  a deep breath.  Had recent falls, back pain, lower extremity weakness,  uses a walker or a cane.  No urinary issues at this time besides her  usual.  No other acute changes on review of systems.   PHYSICAL EXAMINATION:  VITAL SIGNS:  Temperature 98.8, blood pressure  171/64 to 187/67, heart rate 53-68, respiratory rate 18, 94% sats on  room air, 98% sats on 2 liters.  GENERAL:  She is alert and oriented.  HEENT:  Oropharynx is dry.  No JVD.  PULMONARY:  Clear to auscultation bilaterally.  CARDIAC:   Regular.  ABDOMEN:  Soft.  EXTREMITIES:  Moving all fours, large bruises on the left back and left  arm area.   ANCILLARY:  Sodium 138, potassium 3.7, chloride 103, bicarb 28, BUN 21,  creatinine 0.02, glucose 106 white count 8.8, hemoglobin 11, platelet  count 215,000.  Urinalysis negative, few bacteria and 0 to 2 white  cells.   CT of the abdomen with contrast with T11-L1 compression fracture.  No  acute issues, dilated gallbladder noted.  X-ray shows acute and chronic  left rib fractures, no hemo or pneumothorax   ASSESSMENT:  This is an elderly female being admitted for fracture and  pain control.  She also has failure to thrive time.   PLAN:  1. Admit.  2. Pain control.  3. Home medications.  4. Heel protectors for history of heel ulcers.  5. Avoid Lovenox with large bruise hematoma, hold aspirin for 24      hours.  6. PT/OT case worker, will probably need skilled nursing facility.      She is amenable.  7. Squeezers for DVT prophylaxis.  8. Pulmonary toilet.  9. Ice for back  10.The patient has questions as to why she falls all the time.  I      discussed with her degenerative disc disease, her spinal stenosis      and back issues and neuropathy, potential myelopathy needs to be      pursued further.  I suggestion is to get Dr. Jeral Fruit back involved,      although at this point PT and OT will need to work with the patient      regardless of what her issues are.      Gwen Pounds, MD  Electronically Signed     JMR/MEDQ  D:  03/02/2008  T:  03/03/2008  Job:  [id number]

## 2011-04-11 NOTE — Assessment & Plan Note (Signed)
[facility]                            CARDIOLOGY OFFICE NOTE   NAME:[name], [name]                     MRN:          [id number]  DATE:12/15/2008                            DOB:          October 24, 1922    [name] returns today for further management of the following  issues;  1. Coronary artery disease.  She is now moved into a nursing facility      and has become much less active.  She uses a walker most of the      time though she sits a lot.  She has had no angina.  2. Cerebrovascular disease with nonobstructive plaque.  She is      asymptomatic.  3. Peripheral vascular disease which is stable.  4. Hypertension with a history of orthostatic hypotension and syncope.      She says when she stands with a walker too long and when place, she      starts to fall backwards.  She has had a couple of falls.  Before,      she has not hurt anything.  5. Lower extremity edema.  6. History of nonobstructive renal artery stenosis.  7. History of tobacco use.   She is now at [name].   Medications were reviewed from [name] and have been correct in  the maintenance medication list.   CARDIOVASCULAR MEDICATIONS:  1. Lovastatin 20 mg per day.  2. Norvasc 5 mg a day.  3. Potassium 20 mEq a day.  4. Enteric-coated aspirin 81 mg a day.  5. Lasix 40 mg a day.   PHYSICAL EXAMINATION:  VITAL SIGNS:  Her blood pressure is 157/67, her  pulse is 75 and regular, her weight is 132.  GENERAL:  She is in no acute distress.  She is chronically ill  appearing.  NECK:  Supple.  Carotid upstrokes were equal bilaterally without bruits,  no JVD.  Thyroid is not enlarged.  Trachea is midline.  LUNGS:  Decreased breath sounds throughout.  She has some mild  expiratory rhonchi.  HEART:  Regular rate and rhythm.  No gallop.  ABDOMEN:  Soft, good bowel sounds.  EXTREMITIES:  No edema.  Pulses are intact.  There is no sign of DVT.  She has  some ecchymoses on her skin.  No major trauma.  MUSCULOSKELETAL:  Chronic arthritic changes.   Her electrocardiogram demonstrates sinus rhythm with nonspecific ST-  segment changes.   ASSESSMENT AND PLAN:  [name] is stable from my standpoint.  I have  chosen not to push her blood pressure control aggressively because of  her tendency to have orthostatic hypotension and syncope.  I would aim  on keeping her blood pressure around 150-160 systolically.   We will see her back in a year.  I have again made no changes in her  program.     Maisie Fus C. Daleen Squibb, MD, [name]  Electronically Signed    TCW/MedQ  DD: 12/15/2008  DT: 12/16/2008  Job #: [id number]

## 2011-04-14 NOTE — Discharge Summary (Signed)
[name], [name]              ACCOUNT NO.:  [id number]   MEDICAL RECORD NO.:  [id number]          PATIENT TYPE:  INP   LOCATION:  1610                         FACILITY:  [facility]   PHYSICIAN:  Valerie A. Felicity Coyer, MDDATE OF BIRTH:  Apr 12, 1922   DATE OF ADMISSION:  12/23/2006  DATE OF DISCHARGE:  12/31/2006                               DISCHARGE SUMMARY   DISCHARGE DIAGNOSIS:  1. Failure to thrive, multifactorial, for short-term rehabilitation:      Further outpatient followup with primary MD, Dr. Tawny Asal, as needed.  2. Escherichia coli urinary tract infection with resistance to      fluoroquinolones:  To begin Keflex t.i.d. for seven days.  See      details below.  3. Acute on chronic bronchitis with underlying chronic obstructive      pulmonary disease and ongoing tobacco abuse, status post seven days      of Avelox.  Chest x-ray on December 30, 2006, with chronic      interstitial changes and no acute disease.  Continue home O2 2      liters as needed. Recommend cessation of tobacco.  4. Hypertension.  5. Chronic obstructive pulmonary disease.  Please see number two.  6. Chronic low back pain with degenerative disk disease and      osteoporotic compression fracture history:  Continue Duragesic plus      Mobic and Vicodin p.r.n.  7. Hypothyroidism.  8. Chronic depression.  9. Dyslipidemia.  10.History of paroxysmal atrial fibrillation, not an anticoagulation      candidate, secondary to falls.  11.History of coronary artery disease with percutaneous transluminal      coronary angiography x3 and stent x1.  12.History of congestive heart failure with normal ejection fraction      on 2-D echocardiogram in 2005:  Continue low-dose Lasix.  13.History of gait disorders with falls, now for short-term nursing      facility rehabilitation.  14.Chronic constipation:  Continue Reglan plus p.r.n. laxative of      choice and Lactulose.  15.Hypokalemia, status post  replacement, normalized.  16.History of non-obstructive renal artery stenosis.  17.History of cerebrovascular disease with bilateral carotid artery      disease.   DISCHARGE MEDICATIONS:  1. Keflex 250 mg p.o. q.i.d. x7 days, dose begun on the morning of      December 31, 2006.  The patient will discontinue Macrobid for      prophylaxis, due to antibiotic resistance.  2. Lasix 40 mg once daily.  3. Aspirin 82 mg once daily.  4. Vesicare 5 mg once daily.  5. Celexa 20 mg daily.  6. Duragesic 50 mcg patch, change q.72h.  7. Inderal LA 80 mg p.o. q.d.  8. Reglan 5 mg p.o. q.a.c. and q.h.s.  9. Mobic 15 mg p.o. q.d.  10.Pletal 100 mg p.o. q.d.  11.Protonix 40 mg once daily.  12.Synthroid 88 mcg once daily.  13.Potassium chloride 20 mEq p.o. q.a.m.  14.Restasis eye drops 0.05% OU b.i.d.  15.Travatan 0.004%, one drop OU q.h.s.  16.Lovastatin 20 mg p.o. q.h.s.  17.Mucinex DM one tab p.o. b.i.d.  18.Norvasc 5 mg p.o. q.d.  19.Lactulose 30 mL p.o. t.i.d. p.r.n. constipation.  Hold for loose      stools.  20.Senokot-S one p.o. q.h.s.  21.DuoNeb's b.i.d. plus q.4h. p.r.n.  22.Nasal cannula oxygen 2 liters at all times.   CONDITION ON DISCHARGE:  Medically improved and stable, albeit weak and  debilitated, for short-term rehab at [facility].   FOLLOWUP:  Will be per the medical facility at [facility] with  outpatient followup by primary MD, Dr. Tawny Asal as needed  upon release.   HOSPITAL COURSE:  #1 - DEBILITY WITH FAILURE TO THRIVE:  The patient is  an 77 year old woman with multiple medical problems who had recently  been hospitalized earlier in the month of January from December 09, 2006,  through December 12, 2006, with weakness and failure to thrive.  During  that time her medications were adjusted and she was felt at that time by  PT and OT to not require any further rehab needs.  As such, she was  discharged home, where she remained weak, suffering a fall on the   morning of admission, prompting a return to the emergency room.  She had  also had increase in her cough, questioning bronchitis versus  bronchopneumonia.  She was begun on IV Avelox.  She also had a urine  culture done, which again returned with E. coli, but sensitivity showed  multiple resistances, including Macrobid and quinolones.  Despite a  reported history of a penicillin allergy, was begun on Keflex on the day  of discharge.  If she tolerates this well, with the first dose this  morning at [facility], if no adverse reaction, will continue a  seven-day treatment, with further repeating of urine culture.  Will  discontinue Macrobid, as it has been used for prophylaxis and antibiotic  result on the culture shows resistance.  Her other medications are  numerous and are listed above.  Hopefully with the assistance of the  skilled facility, will be able to be better managed and maintained  without confusion.  Will have needed increase in her antihypertensive  regimen this hospitalization, as reflected above, with the addition of  Norvasc and may need continued titration on an ongoing basis.  Per PT  and OT recommendations, will discharge to a skilled facility.  The  family has selected [facility], and the patient is felt medically  stable for discharge.   #2 - CHRONIC OBSTRUCTIVE PULMONARY DISEASE WITH ONGOING TOBACCO AND  CHRONIC BRONCHITIS:  Again repeated chest x-rays this admission have  shown no active disease or evidence of infiltration.  Will continue low-  dose Lasix for her history of chronic presumed diastolic dysfunction  with potassium supplementation as well as oxygen as needed.  She is  status post a seven-day course of Avelox for presumed chronic  bronchitis.  Will continue on Mucinex as well as nebulizers and home  oxygen and GI agent such as Reglan or Protonix for minimization of any reflux component of her cough and bronchospasm.  The patient is reminded   again of the importance of tobacco cessation in her quest to conquer  this disease and regain full breathing capacity.   #3 - CHRONIC LOW BACK PAIN:  This is much of the patient's debility and  her indication for a Duragesic patch.  She has not received any p.r.n.  Vicodin but has remained on daily Mobic, anti-inflammatory for this  condition.  Further followup per  the medical facility as well as ongoing  therapy.   #4 - OTHER CHRONIC MEDICAL ISSUES:  The patient's hypertension,  hyperthyroidism and depression are as prior to admission.   #5 - CHRONIC CONSTIPATION:  The patient's chronic constipation has been  treated with Reglan, lactulose as well as laxative of choice and  Senokot.  This hopefully will be improved also by increasing mobility  and possible further reduction of her narcotic dosing on an outpatient  ongoing basis.   DISCHARGE PHYSICAL EXAMINATION:  VITAL SIGNS:  Temperature 97 degrees,  blood pressure 154/82, pulse 59, respirations 16, saturation 93% on 2  liters.  GENERAL:  She is a frail, weak and debilitated elderly white woman, who  is in no acute distress.  She is somewhat depressed.  HEENT:  Wears corrective lenses.  HEENT otherwise unremarkable.  LUNGS:  Show a few bilateral rhonchi, left greater than right, but  generally clear with cough.  She has no increased work of breathing or  wheeze.  CARDIOVASCULAR:  Distant but irregular.  No appreciable murmurs or rubs.  ABDOMEN:  Protuberant but soft with good bowel sounds.  EXTREMITIES:  Show 1+ edema, slightly greater on the left than the  right, but with no swelling or tightness.  No erythema.  Varicosities  without change.  NEUROLOGIC:  She is awake, alert and oriented x3, but with generalized  slowing questionably due to depression.  Reported history of a resting  tremor, though none noted at this time in the bedside chair.  No focal  sensory or motor deficits noted grossly.   LABORATORY DATA:  Remarkable  for E. coli urinary tract infection on  December 27, 2006, sensitive to cephazolin and Rocephin, resistant to  Cipro, Bactrim, Levaquin and Macrobid.  Other recent laboratory data as  per the E-chart and included in the records.      Valerie A. Felicity Coyer, MD  Electronically Signed     VAL/MEDQ  D:  12/31/2006  T:  12/31/2006  Job:  [id number]

## 2011-04-14 NOTE — Discharge Summary (Signed)
NAMEIVETT, LUEBBE              ACCOUNT NO.:  [id number]   MEDICAL RECORD NO.:  [id number]          PATIENT TYPE:  INP   LOCATION:  1304                         FACILITY:  [facility]   PHYSICIAN:  Rene Paci, M.D. LHCDATE OF BIRTH:  Feb 28, 1922   DATE OF ADMISSION:  12/08/2005  DATE OF DISCHARGE:                                 DISCHARGE SUMMARY   DISCHARGE DIAGNOSES:  1.  Weakness and failure-to-thrive, multifactorial; see below.  2.  Dehydration secondary to over diuresis, improved reduced dose Lasix.  3.  History of gait disorder with falls at baseline status post physical      therapy/occupational therapy evaluation for home health physical      therapy; too high-level for skilled facility or subacute unit.  4.  History of congestive heart failure.  5.  History of coronary disease with percutaneous transluminal coronary      angioplasty x3 and stent x1.  6.  History of paroxysmal atrial fibrillation, not anticoagulation candidate      secondary to falls.  7.  Escherichia coli urinary tract infection; continue treatment with      Macrobid plus prophylaxis as per Dr. Logan Bores.  8.  Dyslipidemia; continue home statin.  9.  Chronic obstructive pulmonary disease with ongoing tobacco abuse.  10. Chronic low back pain secondary to degenerative disc disease and      osteoporotic compression fracture history; continue Duragesic at      decreased dose plus Vicodin p.r.n.  11. Hypothyroidism.  12. Hypertension.  13. Depression.   DISCHARGE MEDICATIONS:  Include the following changes:  1.  Decrease in Duragesic from 75 mcg to 50 mcg patch, change every 72      hours.  2.  Lasix 40 mg one tablet daily, decreased from one b.i.d.  3.  Klor-Con 10 mEq tablets two p.o. q.a.m., decreased from one t.i.d.  4.  Macrobid one p.o. b.i.d. x5 days, then may resume once daily as before.  5.  Reglan 5 mg p.o. q.i.d.   Other medications are as prior to admission and include:  1.  Inderal LA 80 mg  p.o. daily.  2.  Synthroid 88 mcg p.o. daily.  3.  Foltx one tablet p.o. daily.  4.  Citalopram 20 mg p.o. daily.  5.  Baby aspirin 81 mg p.o. daily.  6.  Travatan and Restasis eyedrops q.h.s.  7.  Boniva 150 mg once monthly.  8.  Hyoscyamine 0.375 mg p.o. b.i.d.  9.  Pletal 100 mg p.o. q.a.m.  10. Protonix 40 mg p.o. daily.  11. Lovastatin 20 mg p.o. q.h.s.  12. Hydrocodone/APAP 5 mg/500 mg one p.o. q.4h. p.r.n.   DISPOSITION:  The patient is discharged home where she lives alone. Home  health PT will be arranged to make a home visit and therapy as needed.  Hospital follow-up is scheduled with primary care physician, Dr. Scotty Court,  for Thursday, December 14, 2005, at 10:30 a.m. May also contact cardiologist,  Dr. Daleen Squibb, for a follow-up appointment if not already scheduled in the next  several weeks, but no pressing urgent issues.   CONDITION ON DISCHARGE:  Medically improved but remains depressed, with  complaints of pain and fatigue but no acute medical issues.   HOSPITAL COURSE BY PROBLEM:  WEAKNESS WITH FAILURE-TO-THRIVE. The patient is  an 77 year old woman with history of failure-to-thrive and gait disorder who  presented to the emergency room secondary to complaints of same. ER  evaluation found a potassium of 2.9 and elevation in BUN and creatinine  consistent with mild dehydration, likely due to over diuresis. She was  admitted for gentle IV fluids and medication adjustment. Her potassium was  corrected with oral replacement and her dose of Lasix was also decreased. A  low dose of Lasix was resumed due to her history of CHF on the day prior to  discharge as well as a moderate dose of potassium at 20 mEq daily, decreased  from 30 mEq as was scheduled prior to admission, but also on a decreased  dose of Lasix from prior to admission. Outpatient follow-up on these levels  for fluid balance as well as potassium normalization and maintenance will be  need to maintain by primary  M.D. Other changes in her medication regimen  included a decrease in her Duragesic at it was felt this was likely  contributing to her failure-to-thrive and generalized weakness. With this  change the patient also complained of increased back pain and side pain  where she fell against a chair prior to admission. She was encouraged to  continue trying the decreased dose of Duragesic 50 mcg daily as well as  continuing p.r.n. Vicodin as prior to admission.   There were multiple issues with the patient's polypharmacy and it was  unclear despite the patient's handwritten note what doses of medications and  how frequently she was taking them, and she seemed unsure about this issue  herself. Pharmacy was involved in clarifying these issues and the list above  reflects what is hopefully the most accurate list. They will need to be  verified by primary M.D. with the patient's actual medication bottles. She  is asked to bring these bottles to her next appointment.      Rene Paci, M.D. [facility]  Electronically Signed     VL/MEDQ  D:  12/12/2005  T:  12/12/2005  Job:  [id number]

## 2011-04-14 NOTE — Discharge Summary (Signed)
NAMESHEYANNE, [name]              ACCOUNT NO.:  [id number]   MEDICAL RECORD NO.:  [id number]          PATIENT TYPE:  INP   LOCATION:  5524                         FACILITY:  [facility]   PHYSICIAN:  Georgina Quint. Plotnikov, M.D. Romualdo Bolk OF BIRTH:  [birth year]   DATE OF ADMISSION:  10/07/2004  DATE OF DISCHARGE:  10/14/2004                                 DISCHARGE SUMMARY   FINAL DIAGNOSES:  1.  Urinary tract infection with Staph coag negative.  Completed vancomycin      IV course.  2.  Failure to thrive, weakness, gait disorder.  3.  History of falls prior to admission.  4.  Hypertension, suboptimal control.  May need some medication adjustment      soon.  5.  Hypokalemia, corrected.  6.  Paroxysmal atrial fibrillation, rate controlled.  7.  History of coronary disease.  8.  Tobacco smoking.  9.  Chronic obstructive pulmonary disease unknown due to previous problem.  10. Low back pain, compression fracture T11, likely old diffuse degenerative      disk disease.  11. Excessive drowsiness with Percocet, resolved.  12. Osteoporosis.  13. Hypoxia, on oxygen.   HISTORY:  The patient is an 77 year old female who was admitted by Dr. Drue Novel  on October 08, 2004.  For the details, please address to his history and  physical.  During the course of hospitalization, she was preparing for  nursing home admission, multiple medical issues were dealt with accordingly.  On the day prior to discharge, she was feeling quite well.  Her temperature  was 97.7, respirations 20, heart rate 62, blood pressure 174/78, saturations  92% on room air.  She was afebrile.  She was in no acute distress.  HEENT  with moist mucosa.  Lungs clear to auscultation with decreased breath sounds  bilaterally.  No wheezes.  Abdomen soft, nontender.  Bowel sounds present.  Lower extremities without edema.  LS spine tender with range of motion.  Skin with aging changes.  Cranial nerves II-XII nonfocal.  Muscle strength  grossly  nonfocal.  She was alert, oriented, and cooperative.   LABORATORY DATA:  Chest x-ray showed atelectasis.  Her potassium on October 14, 2004 was 3.6.  Urine cultures were Staph coag negative.  Hemoglobin on  admission 13.4, platelets 214, white count 13.0, 78% neutrophils, sodium 139-  142, potassium 3.4-3.6.  Liver tests normal.  TSH 0.6.  EKG with atrial  fibrillation.  Echocardiogram showed overall left ventricular function was  normal.  Ejection fraction 55-65%, borderline LVH.  There was mild aortic  valvular regurgitation.  The left atrium was mildly dilated.  The right  atrium was mildly dilated too.   ALLERGIES:  1.  PENICILLIN.  2.  CODEINE.  3.  METOPROLOL.   FOOD ALLERGIES:  None.   DISCHARGE MEDICATIONS:  1.  Zocor 20 mg daily.  2.  Levoxyl 88 mcg daily.  3.  Furosemide 40 mg daily.  4.  Lexapro 10 mg daily.  5.  Propranolol 20 mg b.i.d.  6.  Duragesic 50 mcg patch change q.72h.  7.  Potassium chloride 20 mEq  daily.  8.  Tylenol 650  p.r.n.  9.  Tylox 5/325 q.i.d. p.r.n.  10. Laxative of choice.  11. Antacid of choice.   Please note that will stop Premarin.       AVP/MEDQ  D:  10/14/2004  T:  10/15/2004  Job:  [id number]   cc:   Ellin Saba., M.D.  [address] [place]  [place] [address]  Fax: [phone]

## 2011-04-14 NOTE — Discharge Summary (Signed)
NAME:  [name], [name]                        ACCOUNT NO.:  [id number]   MEDICAL RECORD NO.:  [id number]                   PATIENT TYPE:  INP   LOCATION:  3710                                 FACILITY:  [facility]   PHYSICIAN:  Arturo Morton. Riley Kill, M.D. [facility]         DATE OF BIRTH:  Jun 30, 1922   DATE OF ADMISSION:  12/25/2002  DATE OF DISCHARGE:                           DISCHARGE SUMMARY - REFERRING   DISCHARGE DIAGNOSES:  1. Dizziness, resolved.  2. Chest pain, resolved.  3. Well known coronary artery disease, status post percutaneous coronary     intervention to the right coronary artery approximately three years ago.  4. Hypertension, treated.  5. Tobacco abuse.  6. Arthritis.  7. Hyperthyroidism, treated.  8. Hypercholesterolemia, treated.   BRIEF HISTORY:  The patient is a 77 year old female who was admitted to [facility] on 12/25/02 after complaining of ears ringing, associated with  nausea and vomiting.  She did have some chest heaviness when she went to bed  and she subsequently worsened and presented to the emergency room.   LABORATORY DATA:  During her hospitalization revealed normal cardiac  isoenzymes times three.  White count was 8.1, hemoglobin 11.9, hematocrit  34.6, platelets 235, sodium 133, potassium 3.8, BUN 24, creatinine 1.4.  UA  C&S was negative.   HOSPITAL COURSE:  During hospitalization, she was noted to have significant  bradycardia with a heart rate into the 30s while on Inderal.  Eventually,  her Inderal was stopped for this reason.   Because of her chest pain, she did undergo a Cardiolite, and this revealed  no ischemia or infarction with normal LV wall motion, EF of 64%.   Because of her dizziness, she also had a CT of the head and sinus areas.  The head CT revealed no acute intracranial process as well as a negative  sinus CT scan.  Chest x-ray revealed no active disease.   During her hospitalization, she did develop a headache and [facility]  Medicine service was consulted.  Again, CT scan of the head and sinuses were  negative as mentioned above.  Eventually, an ENT consult was obtained per  Dr. Arletha Grippe from the ENT service.  The patient had been seen by Dr. Narda Bonds one week prior to admission for acute epistaxis which was controlled  with silver nitrate.  It was felt that time because of headache and light-  headedness, it was not ENT related.  The process could be related to acute  viral labyrinthitis which would resolve with supportive care.   Ultimately, the patient was felt to be ready for discharge home.  Essentially, her Inderal was stopped during the hospitalization and she was  started on Norvasc for hypertensive control.  In addition, we will go ahead  and place her on a PPI for her atypical chest discomfort.   DISCHARGE MEDICATIONS:  1. Norvasc 2.5 mg a day.  2. Protonix 40 mg, one  p.o. every day.  3. Synthroid 100 mcg a day.  4. Lipitor 10 mg q. h.s.  5. Premarin 1.25 mg, one half tablet daily.  6. Micardis HCT 80/25, one tablet daily.  7. Vioxx 25 mg a day.  8. Foltx daily.  9. Lasix 40 mg a day.  10.      Potassium 10 mEq two tablet daily.  11.      Ditropan XL 10 mg a day.  12.      Sublingual nitroglycerin p.r.n. chest pain.  13.      Restasis eye drops as prior to admission.  14.      Baby aspirin daily.  15.      Vitamin C Co-Enzyme q. 10 daily.  16.      Calcium daily.  17.      Lexapro 10 mg, one half tablet daily.  18.      Hydrocodone as prior to admission.  19.      She is to stop her Inderal.  20.      For pain, she may utilize sublingual nitroglycerin as needed for     chest pain or Tylenol as needed for pain, one to two tablets q.6h. p.r.n.   DISCHARGE INSTRUCTIONS:  1. No strenuous activity for two days, then gradually increase activity.  2. Remain on a low fat, low salt, low cholesterol diet.  3. She is to follow up with Dr. Valera Castle on 01/12/03 at 9:15 a.m.  4. If she  continues to have episodes of vertigo or this recurs, she needs to     contact Drs. Stafford/Chris [facility].     Guy Franco, P.A. [facility]                      Thomas D. Riley Kill, M.D. [facility]    LB/MEDQ  D:  01/01/2003  T:  01/01/2003  Job:  [id number]   cc:   Kristine Garbe. Ezzard Standing, M.D.  [address]  [place] [address]  Fax: [phone]   Ellin Saba., M.D.  [address] [place]  [place] [address]  Fax: [phone]   Jesse Sans. Wall, M.D. [facility]  [address]  [place]  [place] [address]  Fax: [phone]

## 2011-04-14 NOTE — H&P (Signed)
NAMEALAJAH, WITMAN              ACCOUNT NO.:  [id number]   MEDICAL RECORD NO.:  [id number]          PATIENT TYPE:  EMS   LOCATION:  ED                           FACILITY:  [facility]   PHYSICIAN:  Corwin Levins, MD      DATE OF BIRTH:  [birth year]   DATE OF ADMISSION:  12/23/2006  DATE OF DISCHARGE:                              HISTORY & PHYSICAL   CHIEF COMPLAINT:  Increasing cough over the last several days with  weakness and fall this morning.   HISTORY OF PRESENT ILLNESS:  Mrs. Pates is an 77 year old white  female with rather complex past medical history and polypharmacy.  He  was brought to the emergency room today by EMS, called by the daughter.  The patient did have a worsening productive cough the last several days  with increasing weakness and fall this morning.  She struck the top of  her head but fortunately there was no swelling, laceration, bruising or  loss of consciousness.  No other complaints such as chest pain, nausea,  vomiting, diaphoresis, constipation or syncope.  She is now for  admission for further evaluation and treatment.   PAST MEDICAL HISTORY:  1. Coronary artery disease.  2. Congestive heart failure.  3. History of paroxysmal atrial fibrillation, non-Coumadin candidate      secondary to falls.  4. Cerebrovascular disease with bilateral carotid artery disease.  5. Peripheral vascular disease.  6. Hypocholesterolemia.  7. Hypotension.  8. History of orthostatic hypotension with syncope.  9. Resting tremor.  10.Nonobstructive renal artery stenosis.  11.Chronic obstructive pulmonary disease.  12.Chronic low back pain, felt secondary to DDD, DJD and osteoporotic      fractures.  13.History of osteoporosis.  14.Hypothyroidism.  15.Depression.   PAST SURGICAL HISTORY:  1. Back surgery x5.  2. Status post bladder surgery.   ALLERGIES:  PENICILLIN, CODEINE, __________.   CURRENT MEDICATIONS:  1. Aspirin 81 mg one daily.  2. Vesicare 5 mg p.o.  daily.  3. Celexa 20 mg daily.  4. Lovastatin 20 mg daily.  5  Duragesic 50 q.3 days.  1. Inderal LA 80 mg daily.  2. Lasix 40 mg q.a.m.  3. Macrobid b.i.d.  4. Reglan 5 mg q.i.d.  5. Mobic 15 mg daily.  6. Pletal 100 mg daily.  7. Protonix 40 mg daily.  8. Synthroid 88 mcg daily.  9. Travatan 0.04% one drop q.p.m.  10.Restasis eye drops.  11.Klor-Con 10 mEq two q.a.m.  12.Boniva 150 mg one q. Month.   SOCIAL HISTORY:  She lives alone but has an attentive daughter.  Alcohol  none.  Tobacco one pack per day.   FAMILY HISTORY:  Noncontributory.   REVIEW OF SYSTEMS:  Positive for constipation.   PHYSICAL EXAMINATION:  VITAL SIGNS:  Temperature 100.0, O2 saturation  95%, respirations 20, heart rate 55, blood pressure 153/89.  HEENT:  Sclerae are clear.  TM's clear.  Pharynx benign.  Head  normocephalic, atraumatic.  NECK:  Without lymphadenopathy or JVD or thyromegaly.  CHEST:  Decreased breath sounds at the bases.  CARDIAC:  Regular rate and rhythm.  ABDOMEN:  Soft, nontender, positive bowel sounds, except for some mild  diffuse tenderness consistent with constipation.  EXTREMITIES:  No edema.   LABORATORY DATA:  UA negative.  Electrolytes with potassium 2.9, BUN 20,  creatinine 1.0, glucose 124, LFT's within normal limits, white blood  cell count 13.1, hemoglobin 14.0 . ECG sinus rhythm with nonspecific ST-  T wave changes.  Head CT no acute disease.  CT of the C spine no acute  disease.  Chest x-ray shows cardiomegaly, chronic interstitial lung  disease and stable bright pulmonary nodule.   ASSESSMENT/PLAN:  1. Cough productive in the setting of elevated white blood cell count      and fever.  Chest x-ray is negative, but likely has at least some      bronchitis and probable pneumonia.  She is to be admitted, given      O2, IV Avelox after sputum and blood cultures.  2. Hypokalemia, replace IV and recheck.  3. Dehydration, IV fluid to be started.  4. Other medical  problems, otherwise stable, continue home      medications.  5. Prophylaxis Lovenox 40 mg subcu daily, continue PPI.  6. Code status is full code for now.  7. Disposition to home when improved, or may need short term skilled      nursing facility.  8. Constipation, to give Laxative of choice.      Corwin Levins, MD  Electronically Signed     JWJ/MEDQ  D:  12/24/2006  T:  12/24/2006  Job:  [id number]   cc:   Arlana Pouch, M.D.   Ellin Saba., MD  [address] [place]  [place] [address]

## 2011-04-14 NOTE — Discharge Summary (Signed)
NAME:  [name], [name]                        ACCOUNT NO.:  [id number]   MEDICAL RECORD NO.:  [id number]                   PATIENT TYPE:  INP   LOCATION:  4710                                 FACILITY:  [facility]   PHYSICIAN:  Bruce Rexene Edison. Swords, M.D. [facility]           DATE OF BIRTH:  18-Mar-1922   DATE OF ADMISSION:  06/25/2004  DATE OF DISCHARGE:  06/28/2004                                 DISCHARGE SUMMARY   DISCHARGE DIAGNOSES:  1. Generalized weakness.  2. Right shoulder pain.  3. Right lower quadrant pain.   HISTORY OF PRESENT ILLNESS:  [name] is an 77 year old, white female  who is a poor historian.  She states she has felt poorly 2-3 days prior to  admission.  The patient was unable to provide any specific details.   PAST MEDICAL HISTORY:  1. Recent release of stenosing tenosynovitis on March 21, 2004.  2. Coronary artery disease, status post PTCA x3 and stent x1.  3. Hypertension.  4. Continued tobacco use.  5. Osteoarthritis.  6. Back surgery x5.  7. Bladder surgery.  8. Hypothyroidism.  9. Hyperlipidemia.   HOSPITAL COURSE:  Problem 1.  CARDIOVASCULAR:  The patient presented with  generalized discomfort.  Serial cardiac enzymes were negative.  The patient  did rule out for myocardial infarction.  No further cardiology workup was  pursued.   Problem 2.  GASTROINTESTINAL:  The patient also presented with right lower  quadrant pain nonspecific.  CT of the abdomen and pelvis revealed question  of pancreatitis versus gastritis.  We did obtain amylase and lipase which  were unremarkable except for a mildly elevated lipase.  Otherwise, LFTs were  normal and the patient's symptoms did subside.  No further GI workup was  pursued.   Problem 3.  HYPOTHYROIDISM:  The patient has known hypothyroidism and is on  Synthroid.  Her TSH was low at 0.285, therefore we did decrease her doses of  Synthroid.   Problem 4.  HYPERTENSION:  The patient has labile hypertension.  Systolic  blood pressures ranged in the 120s-170s.  Currently, she is on Inderal and  Lasix.  The patient probably needs further adjustments of her  antihypertensive regimen, but this can be done again as an outpatient.   Problem 5.  RIGHT SHOULDER PAIN:  She complained of this later in her  hospitalization.  Plain films of her right shoulder confirmed degenerative  changes, but no other acute bony abnormalities.  This was probably  consistent with osteoarthritis.   DISCHARGE LABORATORY DATA AND X-RAY FINDINGS:  TSH was 0.285.  Hemoglobin  12.4.  CMET was normal.  AST 40.  Urinalysis was negative.   DISCHARGE MEDICATIONS:  1. Inderal LA 60 mg q.d.  2. Lipitor 10 mg q.d.  3. Synthroid (new dose) 88 mcg q.d.  4. Valium 5 mg b.i.d.  5. Lasix 40 mg b.i.d.  6. K-Dur 10 mEq b.i.d.  7. Lexapro 10 mg  q.d.  8. Foltx q.d.  9. Premarin 0.625 mg q.d.   FOLLOW UP:  Follow up with Dr. Scotty Court in the next 1-2 weeks.      Cornell Barman, P.A. [facility]                  Bruce H. Swords, M.D. [facility]    LC/MEDQ  D:  06/28/2004  T:  06/28/2004  Job:  [id number]   cc:   Ellin Saba., M.D.  [address] [place]  [place] [address]  Fax: [phone]

## 2011-04-14 NOTE — Assessment & Plan Note (Signed)
[facility]                            CARDIOLOGY OFFICE NOTE   NAME:[name], [name]                     MRN:          [id number]  DATE:12/06/2006                            DOB:          06-Feb-1922    [name] returns today for management of the following issues.  1. Obstructive coronary artery disease. She is having no angina, but      is very inactive.  2. Cerebral vascular disease with non-obstructive internal carotid      artery plaque with integrated flow in both vetebral's. She is      asymptomatic.  3. Peripheral vascular disease.  4. Hyperlipidemia.  5. Hypertension with history of orthostatic hypotension and syncope.  6. Resting tremor.  7. Lower extremity edema.  8. History of non-obstructive renal artery stenosis.   She continues to smoke heavily. She is very slow to get around. She  still has dizzy spells and has fallen twice. The last time she hurt her  left knee.   Her medications are pertinent for only:  1. Aspirin 81 mg daily.  2. Simvastatin 40 mg daily.  She is on other medications, non-cardiac related.   VITAL SIGNS: Blood pressure 153/69, pulse 61 and regular, weight is 137.  GENERAL: She smells strongly of tobacco.  HEENT: Normocephalic atraumatic. PERRLA, extraocular movements intact,  sclerae clear. Facial symmetry is normal.  Carotid upstrokes are equal bilaterally with soft systolic sounds  bilaterally.  Thyroid is not enlarged, trachea is midline.  LUNGS: Decreased breath sounds throughout with rhonchi.  HEART: Nondisplaced PMI. She has normal S1 and S2.  ABDOMEN: Soft, with good bowel sounds, no midline bruits, there is no  hepatomegaly.  EXTREMITIES: Dry skin with varicose veins. She has trivial edema. Pulses  were palpable. Dorsalis pedis doing 2+/4+ bilaterally.  NEUROLOGIC: Grossly intact.   EKG is normal.   [name] is status quo. I am reluctant to treat her blood pressure,  because of the fear  of falling and orthostatic hypotension and syncope.  She would rather not have it treated as well. I have left well enough  alone. I plan on seeing her back in a year.     Thomas C. Daleen Squibb, MD, [facility]  Electronically Signed    TCW/MedQ  DD: 12/06/2006  DT: 12/07/2006  Job #: [id number]   cc:   Ellin Saba., MD

## 2011-04-14 NOTE — Op Note (Signed)
NAME:  [name], [name]                        ACCOUNT NO.:  [id number]   MEDICAL RECORD NO.:  [id number]                   PATIENT TYPE:  AMB   LOCATION:  DSC                                  FACILITY:  [facility]   PHYSICIAN:  John L. Rendall III, M.D.           DATE OF BIRTH:  1922-04-19   DATE OF PROCEDURE:  03/21/2004  DATE OF DISCHARGE:                                 OPERATIVE REPORT   PREOPERATIVE DIAGNOSIS:  Stenosing tenosynovitis left long finger (trigger  finger).   PROCEDURE:  Release annular ligament at MP flexion crease.   POSTOPERATIVE DIAGNOSIS:  Stenosing tenosynovitis left long finger (trigger  finger).   SURGEON:  John L. Rendall, M.D.   ANESTHESIA:  Local with sedation.   DESCRIPTION OF PROCEDURE:  Under 2% local anesthetic with sedation, the left  hand is prepared with Betadine and draped as a sterile field.  Forearm  tourniquet is used.  The arm is wrapped out with the Esmarch.  An MP flexion  crease incision is made approximately 1.5 cm in length.  Dissection carried  sharply through the skin, but no sharp dissection is carried beneath the  skin.  Blunt dissection is used to find the common digital nerve to the  ulnar side of the long finger and the flexor tendon sheath to the long  finger.  This was then incised in the line of the tendon and a very thick  tight stenotic area is released distally.  The tendon is then elevated out  of its tendon sheath with the Therapist, nutritional and examined. There is no  evidence of fraying or unusual anatomy.  It is then released back into the  tendon sheath where as the tendon before. I kept the finger in a slightly  flexed position and it was now fully extended.  The skin is then sutured  with 3-0 nylon stitch.  A sterile compression bandage is applied and the  tourniquet is let down at approximately seven minutes.  All dissection was  done with 2.5 power loupe magnification.  The patient tolerated the  procedure well and  returned to the recovery room in good condition.                                               John L. Dorothyann Gibbs, M.D.    Renato Gails  D:  03/21/2004  T:  03/21/2004  Job:  [id number]

## 2011-04-14 NOTE — H&P (Signed)
NAME:  [name], [name]                        ACCOUNT NO.:  [id number]   MEDICAL RECORD NO.:  [id number]                   PATIENT TYPE:  INP   LOCATION:  3305                                 FACILITY:  [facility]   PHYSICIAN:  Arturo Morton. Riley Kill, M.D. [facility]         DATE OF BIRTH:  [birth year]   DATE OF ADMISSION:  12/25/2002  DATE OF DISCHARGE:                                HISTORY & PHYSICAL   CHIEF COMPLAINT:  I'm dizzy when I get up.   HISTORY OF PRESENT ILLNESS:  The patient comes to the emergency room with a  multitude of complaints.  Her ears are ringing, she vomited this morning and  she has had a headache for at least a week.  She was placed on an antibiotic  as an outpatient.  When she gets up and moves around she feels dizzy and can  barely walk.  She notes incidentally that she had had some chest discomfort  for the past couple of weeks and cardiology was called to admit the patient  to the hospital.  She denies any dysuria, fever, chills or other major  complaints at the present time.   PAST MEDICAL HISTORY:  1. The patient underwent percutaneous intervention of the right coronary     artery approximately three years ago.  2. The patient has had hypertension times many years.  3. She is a continued smoker for many years.  4. Chronic arthritis.  5. History of five back operations.  6. History of bladder surgery.  7. Hypothyroidism on replacement therapy.  8. Hypercholesterolemia.   ALLERGIES:  PENICILLIN, CODEINE and LOPRESSOR.   MEDICATIONS:  1. Synthroid 0.1 mg p.o. daily.  2. Lipitor 10 daily.  3. Premarin 1.25 mg daily 1/2 tablet daily.  4. Micardis HCT 80/125 1/2 tablet daily.  5. Vioxx 25 daily.  6. Inderal LA 160 mg q.h.s.  7. Furosemide 40 mg daily.  8. Klor-Con 20 mEq.  9. Hydrocodone A Pak p.r.n.  10.      Ditropan 10 daily.  11.      Lexapro 10 mg 1/2 tablet daily.   SOCIAL HISTORY:  The patient continues to smoke.  She is followed as an  outpatient  by Dr. Scotty Court.   FAMILY HISTORY:  Deferred at the present time.   REVIEW OF SYSTEMS:  The patient denies significant fever.  She sates she  maybe has had a fever between 99 and 101.  She complains of cold feet.  She  has had a chronic tremor for which she sees Dr. Noreene Filbert.  She has had a  nosebleed.  She is unsteady when she gets up.  The review of systems is  otherwise negative.   PHYSICAL EXAMINATION:  VITAL SIGNS:  On examination the patient's  temperature is 98.2, pulse 89, respiratory rate 20 and blood pressure 144/62  supine and 144/73 sitting.  HEENT EXAMINATION:  Unremarkable.  NECK:  There are no carotid  bruits.  The neck is supple.  There is no  obvious lymphadenopathy.  LUNGS:  The lung fields are clear to auscultation and percussion.  HEART:  I cannot appreciate a significant murmur.  ABDOMEN:  The abdomen is soft.  There is no definite rebound.  EXTREMITIES:  The extremities reveal no edema.  Distal pulses are intact.   LABORATORY DATA:  Chest x-ray is read as no active disease.  The  electrocardiogram reveals normal sinus rhythm and is within normal limits.  Hemoglobin 11.9, hematocrit 34.6 and platelet count 235,000.  Sodium 133,  potassium 3.8, chloride 94, BUN 24, and creatinine 1.4.  Total protein 6.9  and albumin 3.8.  CK total is 20, CK MB 1.1 and troponin 0.01.  Urinalysis  completely negative.   IMPRESSION:  1. The patient has chest pain.  It is difficult to be sure of the etiology of the chest pain.  It is  somewhat similar to what she has had in the past, but in the context of the  other symptoms it is difficult to know.  1. Unsteady gait.  There is no obvious orthostatic hypotension by examination.  I do not know  whether this is central or not.  1. Chronic tremor by Dr. Noreene Filbert.  2. Hypertension.  3. Recent chronic headache for which she has been on antibiotics by Dr.     Scotty Court with concomitant nosebleed.  4. Hypothyroidism.  5.  Hyperlipidemia.  6. History of prior back operations.  7. Continued smoking history.  8. Chronic arthritis.   PLAN:  1. Admit to the hospital.  2.     The patient will likely need a neuro consult and probable MRI or CT to     better assess her headache,  3. Serial enzymes.  4. Decision will need to be made by Dr. Gerhard Munch.                                                 Arturo Morton. Riley Kill, M.D. [facility]    TDS/MEDQ  D:  12/25/2002  T:  12/26/2002  Job:  [id number]   cc:   Monico Blitz., M.D.  [address] [place]  [place] [address]  Fax: [phone]

## 2011-04-14 NOTE — Cardiovascular Report (Signed)
[place]  Patient:    AHMIYA, ABEE                     MRN: [id number] Adm. Date:  [id number] Attending:  Learta Codding CC:         Lacretia Leigh. Quintella Reichert, M.D.  Thomas C. Wall, M.D. [facility]  Cardiac Catheterization Lab   Cardiac Catheterization  INDICATIONS:  Ms. Carmicheal is 77 years old.  She has had recent chest pain. She has had prior angioplasty of the LAD and circumflex, and these were widely patent.  She has had progressive disease in the right coronary artery with a long 80-90% stenosis.  I reviewed the films in careful detail with Dr. Lewayne Bunting.  It was our feeling that percutaneous coronary intervention should be offered.  We had a long discussion with the patient about the length of the lesion and the fact that her chances of target vessel revascularization would be increased.  She was, however, agreeable to proceed.  PROCEDURE:  Percutaneous stenting of the right coronary artery.  DESCRIPTION OF PROCEDURE:  The procedure was performed from the left femoral artery.  A 7-French sheath was placed.  Heparin was given according to protocol.  A JR4 guiding catheter with sideholes was utilized.  ACT was appropriate.  Integrilin was given by double bolus technique, and the patient was started on a drip.  The lesion was crossed with a 0.014 Hi-Torque Floppy wire.  Predilatation was performed using a 2.75-mm CrossSail, 30-mm length, balloon.  This was somewhat short compared to the length of the lesion, so overlapping inflations were performed.  The vessel dilated well.  A 38-mm length 3.0 Guidant Penta stent was placed and taken up to 15 atmospheres. This looked slightly small, and the vessel was post dilated using a 3.75 Quantum Ranger, 18-mm length, post dilatation balloon.  Final angiographic views were obtained.  All catheters were removed.  Sheaths were sewn into place, and the patient was taken to the holding area in satisfactory  condition.  HEMODYNAMIC DATA:  The central aortic pressure was 162/60.  ANGIOGRAPHIC DATA:  The right coronary artery is a large caliber vessel. There is about 20% narrowing proximally.  There is a long area involving the entire mid vessel of about 80% narrowing.  The distal vessel consists of a posterior descending and several posterolateral branches.  Following stenting, the area was reduced to 0% residual luminal narrowing with an excellent angiographic appearance.  There were two right ventricular branches, both of which were maintained at the completion of the procedure.  There were no obvious complications with the procedure.  CONCLUSIONS:  Successful percutaneous stenting in the right coronary artery.  DISPOSITION:  The patient will be treated medically.  She will need aspirin and Plavix for at least a month and continuous aspirin afterwards. DD:  10/26/00 TD:  10/26/00 Job: [id number]

## 2011-04-14 NOTE — H&P (Signed)
[name], [name]              ACCOUNT NO.:  [id number]   MEDICAL RECORD NO.:  [id number]          PATIENT TYPE:  INP   LOCATION:  1304                         [facility]   PHYSICIAN:  Gordy Savers, M.D. LHCDATE OF BIRTH:  [birth year]   DATE OF ADMISSION:  12/08/2005  DATE OF DISCHARGE:                                HISTORY & PHYSICAL   CHIEF COMPLAINT:  Weakness.   HISTORY OF PRESENT ILLNESS:  The patient is an 77 year old white female who  has a prior history of failure-to-thrive with a gait disorder and history of  prior falls. She states that for the past couple of days her oral intake has  been poor. On the day of admission she fell on two occasions, both times  sustained some soft tissue injury to the scalp. Denies any loss of  consciousness. She states that she simply lost her balance, fell backwards,  without loss of consciousness. ED evaluation revealed electrolyte  abnormalities with a potassium of 2.9 and a BUN of 76. The patient now  admitted for further evaluation and treatment of her weakness and  dehydration.   PAST MEDICAL HISTORY:  The patient has a history of coronary artery disease.  She has required stenting in the past, has been followed by [facility]  Cardiology. She has a long history of chronic low back pain secondary to DJD  and osteoporosis with compression fractures. She has had multiple back  surgeries. Additional diagnoses include hypothyroidism,  hypercholesterolemia. She has a history of UTI and some incontinence.  Additionally, has history of hypertension, paroxysmal atrial fibrillation,  COPD with ongoing tobacco use, and a history of chronic hypoxemia. She has  had bladder surgery in the past as well.   MEDICAL REGIMEN:  Includes the following:  Nitroglycerin p.r.n., Toprol-XL,  fentanyl patch, Synthroid, Lasix, Protonix, potassium chloride, Lovastatin,  aspirin - the dosages unknown.   ALLERGIES:  1.  PENICILLIN.  2.   CODEINE.  3.  AMPHOTERICIN B.   REVIEW OF SYSTEMS:  Exam is otherwise fairly unremarkable. Her daughter  states that she has lost 30-40 pounds over the past year. There has been  some chronic anorexia.   PHYSICAL EXAMINATION:  VITAL SIGNS:  Blood pressure was 110/70, pulse rate  90-96, O2 saturation 92-95%, respiratory rate normal, temperature 97.9.  GENERAL:  Revealed an elderly white female who was alert, no acute distress.  HEAD AND NECK:  Reveals some hematoma formation in both occipital regions.  Pupil responses were normal, conjunctivae clear, ENT unremarkable. Neck  revealed no neck vein distention or bruits.  CHEST:  Revealed a few bibasilar crackles.  CARDIOVASCULAR:  Regular rhythm, no murmur.  ABDOMEN:  Soft and nontender, no organomegaly.  EXTREMITIES:  Trace edema, peripheral pulses were not easily palpable.  NEUROLOGIC:  Negative. Exam was nonfocal.   IMPRESSION:  Dehydration with azotemia, anorexia, gait disorder with  multiple falls.   DISPOSITION:  The patient will be admitted to the hospital, her Lasix will  be held and she will be carefully rehydrated. Laboratory studies will be  reviewed including a chest x-ray.  ______________________________  Gordy Savers, M.D. [facility]     PFK/MEDQ  D:  12/09/2005  T:  12/09/2005  Job:  [id number]

## 2011-04-14 NOTE — H&P (Signed)
[name], [name]              ACCOUNT NO.:  [id number]   MEDICAL RECORD NO.:  [id number]          PATIENT TYPE:  EMS   LOCATION:  MAJO                         FACILITY:  [facility]   PHYSICIAN:  Danae Chen, M.D.DATE OF BIRTH:  December 25, 1921   DATE OF ADMISSION:  10/07/2004  DATE OF DISCHARGE:                                HISTORY & PHYSICAL   PRIMARY CARE PHYSICIAN:  Tawny Asal, M.D.   CHIEF COMPLAINT:  Confusion and fall.   HISTORY OF PRESENT ILLNESS:  The patient is an 78 year old white female  patient of Dr. Scotty Court who, according to her daughter, has been more  confused and has been falling recently due to her excessive use of her pain  medications at home.  The patient reports that she fell today while going to  the bathroom.  She denied any loss of consciousness or chest pain, and no  shortness of breath at the time of her fall.  She only complains that she  has been having chronic low back pain.  She has had several low back  surgeries, and this is her primary issue.   PAST MEDICAL HISTORY:  1.  Coronary artery disease status post PTCA x 3 with a stent placement.  2.  Hypertension.  3.  Continued tobacco use.  4.  Arthritis.  5.  Back surgery x 5.  6.  Bladder surgery.  7.  Hypothyroidism.  8.  Hyperlipidemia.   SOCIAL HISTORY:  She lives on her own.  She continues to smoke.  Denies any  alcohol use.  She has one grown daughter.  I did speak with the daughter at  length, and the main issue from a social standpoint is that she has been  over medicating herself, and this is causing her to be more disoriented and  falling at home, and this is of concern.  The daughter would like for case  management to be able to discuss this issue with her.   MEDICATIONS:  1.  Inderal LA 60 mg p.o. daily.  2.  Lipitor 10 mg p.o. daily.  3.  Synthroid 88 mcg p.o. daily.  4.  Valium 5 mg p.o. b.i.d.  5.  Lasix 40 mg p.o. b.i.d.  6.  K-Dur 10 mEq p.o. b.i.d.  7.   Lexapro 10 mg p.o. daily.  8.  Premarin 0.625 mg p.o. daily.   ALLERGIES:  She is allergic to PENICILLIN and CODEINE.   REVIEW OF SYSTEMS:  Per History of Present Illness.  She denies any chest  pain, nausea, vomiting, diarrhea, or headache.  Positive for low back pain  and some constipation.  No recent weight gain or weight loss.   PHYSICAL EXAMINATION:  GENERAL:  She is alert and answers questions  appropriately.  VITAL SIGNS:  Blood pressure 171/62, pulse 67, respirations 18, O2  saturation 95% on room air.  HEENT:  Oropharynx is clear.  Head is atraumatic and normocephalic.  NECK:  Supple.  No lymphadenopathy.  CARDIAC:  Heart rate is regular, no murmurs.  LUNGS:  Clear.  ABDOMEN:  Soft, nontender.  EXTREMITIES:  No peripheral  edema.  NEUROLOGIC:  She moves all four extremities.  She has 5/5 grip strength  bilaterally.   LABORATORY AND X-RAY DATA:  X-rays showing chronic degenerative joint  disease in her lower lumbar spine, nothing acute.   IMPRESSION:  An 77 year old with frequent falls and some confusion, probably  related to pain medication use at home.   PLAN:  1.  For now, admit and observe overnight and observe her pain medication      intake.  2.  Hydrate her.  3.  Continue her home medications.  4.  Recheck labs in the morning.  5.  We will also get a case management consult to discuss the above issues      in relation to her placement.  6.  Since the patient is a patient of Dr. Scotty Court, Dr. Drue Novel has been      notified of her admission and will assume her care in the morning.       RLK/MEDQ  D:  10/07/2004  T:  10/07/2004  Job:  [id number]

## 2011-04-14 NOTE — Discharge Summary (Signed)
[place]. [facility]  Patient:    [name], [name]                     MRN: [id number] Adm. Date:  [id number] Disc. Date: 10/27/00 Attending:  Learta Codding Dictator:   Abelino Derrick, P.A.C. [facility] CC:         Thomas C. Daleen Squibb, M.D. [facility]  Tinnie Gens C. Quintella Reichert, M.D.   Referring Physician Discharge [place]  DISCHARGE DIAGNOSES: 1. Coronary disease with a history of prior interventions, status post right    coronary artery stenting this admission. 2. Treated hyperlipidemia. 3. Treated hypertension. 4. Urinary tract infection. 5. Chronic obstructive pulmonary disease. 6. Degenerative joint disease.  HISTORY OF PRESENT ILLNESS:  The patient is a 77 year old female followed by Tinnie Gens C. Quintella Reichert, M.D., and Jesse Sans. Wall, M.D.  She has a history of three prior angioplasties, although I do not have these details.  The last catheterization data we have is from 1997, which showed mild coronary artery disease.  HOSPITAL COURSE:  The patient was admitted on October 24, 2000, with chest pain consistent with unstable angina.  The patient was admitted to telemetry. CK-MBs and troponins were obtained.  She was started on heparin and set up for cardiac catheterization.  This was done on October 25, 2000, by Arturo Morton. Riley Kill, M.D., which revealed an 80-90% RCA narrowing, 30-40% circumflex narrowing, 40% LAD, and 40% right renal artery stenosis.  LV function was normal.  The RCA lesion was a long lesion.  After some review, it was decided to approach this percutaneously.  The patient had an elective angioplasty and stenting on October 26, 2000, by Arturo Morton. Riley Kill, M.D.  She tolerated the procedure well.  DISPOSITION:  She is to be discharged later today on October 27, 2000.  DISCHARGE MEDICATIONS:  1. Cipro 250 mg twice a day for five days.  2. Plavix 75 mg a day for four weeks.  3. Premarin 1.25 mg q.d.  4. Prilosec 20 mg a day.  5. Zoloft 100 mg a day.  6.  Synthroid 0.088 mg a day.  7. Lipitor 10 mg a day.  8. Remeron 30 mg at h.s.  9. Micardis 80 mg a day. 10. Hydrochlorothiazide 12.5 mg a day. 11. Atrovent and Serevent inhalers.  LABORATORIES:  Urine culture grew back greater than 100,000 Escherichia coli. CK-MBs and troponins were negative.  Hematology showed a white count of 9.5, hemoglobin 12.6, hematocrit 36, and platelets 177.  Renal function showed sodium 139, potassium 4.8, BUN 12, and creatinine 1.2.  TSH 2.06.  The chest x-ray showed COPD with no evidence of acute disease.  The EKG showed sinus rhythm, sinus bradycardia, and no acute changes.  DISPOSITION:  The patient was discharged in stable condition and will follow up with Maisie Fus C. Wall, M.D., in about two weeks. DD:  10/27/00 TD:  10/27/00 Job: [id number]

## 2011-04-14 NOTE — Consult Note (Signed)
   NAME:  [name], [name]                        ACCOUNT NO.:  [id number]   MEDICAL RECORD NO.:  [id number]                   PATIENT TYPE:  EMS   LOCATION:  ED                                   [facility]ACILITY[facility]  [facility]   PHYSICIAN:  Kristine Garbe. Ezzard Standing, M.D.         DATE OF BIRTH:  1922/08/12   DATE OF CONSULTATION:  11/25/2002  DATE OF DISCHARGE:                                   CONSULTATION   REASON FOR CONSULTATION:  Evaluate patient with right-sided epistaxis.   HISTORY OF PRESENT ILLNESS:  The patient began having nose bleeds two days  ago where she had bleeding at home that stopped spontaneously.  She had  another nose bleed yesterday and was subsequently seen by her medical doctor  this afternoon.  She had profuse bleeding in his office and she was sent to  the emergency room.  In the emergency room the bleeding was attempted to be  stopped with a rhino rocket.  The rhino rocket fell posteriorly into the  oropharynx and she has got a little bit of oozing and I was subsequently  called to assist in control of the right-sided epistaxis.  On physical  examination she had oozing from what appears to be a posterior nose bleed on  the right side, although I could not definitely identify a specific site of  bleeding.  It would ooze just for a minute and then stop but there was no  rapid bleeding.  Apparently, earlier in the emergency room she had fairly  brisk bleeding from the right side.  One little vessel on the septum was  cauterized with silver nitrate.  Following this a Maricel anterior/posterior  pack was placed on the right side and hydrated with Afrin.   IMPRESSION:  Right-sided epistaxis, probably posterior.   RECOMMENDATIONS:  The nose was cauterized and packed with Maricel.  Will  have patient follow up in my office on Monday in six days to have the nasal  pack removed.  I gave her a prescription for Z-PAK to take in the meantime  and Tylenol for pain.                                Kristine Garbe. Ezzard Standing, M.D.    CEN/MEDQ  D:  11/25/2002  T:  11/25/2002  Job:  [id number]

## 2011-04-14 NOTE — H&P (Signed)
NAME:  [name], [name]                        ACCOUNT NO.:  [id number]   MEDICAL RECORD NO.:  [id number]                   PATIENT TYPE:  EMS   LOCATION:  MAJO                                 FACILITY:  [facility]   PHYSICIAN:  Valetta Mole. Swords, M.D. [facility]           DATE OF BIRTH:  Jul 13, 1922   DATE OF ADMISSION:  06/25/2004  DATE OF DISCHARGE:                                HISTORY & PHYSICAL   CHIEF COMPLAINT:  Weakness.   HISTORY OF PRESENT ILLNESS:  Ms. Hemmer is an 77 year old female who is a  poor historian.  She states she has felt poorly for the past two to three  days, seems to be worse today, and she was unable to get off the couch by  herself, so her family encouraged her to come to the emergency department  which she did via EMS.  She denies any specific complaints, although when  asked about any complaint, she has a positive Review of Systems.  She is  unable to determine what is bothering her the most, just that she feels  weak.  She admits to falling yesterday and has some intrascapular  discomfort, but she is not even sure this is from the fall.  She denies any  confusion.  She denies any neurologic deficits.   REVIEW OF SYSTEMS:  Her positive Review of Systems include pain in all  extremities, pain in chest, pain in abdomen, pain in head, pain in eyes.  She denies any fevers or chills but does admit to being cold at times.  She  denies any rashes but says that her skin itches.  She does admit to a  decreased appetite but denies any loss of weight.  She admits to a decreased  fluid intake but says that she urinates at least every three hours with a  significant amount of urine.  She admits to chronic low back pain which has  been going on for many years.  No other specific complaints in Review of  Systems.   PAST MEDICAL HISTORY:  1. Recent release of stenosing tenosynovitis March 21, 2004.  2. Coronary artery disease.  She says she has had three PTCAs and stent x 1.  3. High blood pressure.  4. She continues to smoke.  5. Osteoarthritis.  6. Back surgery x 5.  7. Bladder surgery.  8. Diagnosis of hypothyroidism.  9. Diagnosis of hyperlipidemia.   MEDICATIONS:  1. Inderal LA 60 mg p.o. daily.  2. Lipitor 10 mg p.o. daily.  3. Synthroid 0.1 mg p.o. daily.  4. Valium 5 mg p.o. b.i.d.  5. Furosemide 4 mg p.o. b.i.d.  6. K-Dur 10 mEq p.o. b.i.d.  7. Lexapro 10 mg p.o. daily.  8. Foltx 1 p.o. daily.  9. Premarin 0.625 mg p.o. daily.  10.      Pain relievers including Vicodin, Parafon Forte.  11.      Nitroglycerin p.r.n. (none recently).  12.  She also uses 2 eye drops, one of which sounds like and artificial     tears; the other one she spells as Travatan 1 drop each eye daily.   ALLERGIES:  PENICILLIN, CODEINE, LOPRESSOR.  She does not know the reaction  to any of these.   SOCIAL HISTORY:  She lives alone.  One daughter looks in on her.  She smokes  one pack per day.   PHYSICAL EXAMINATION:  VITAL SIGNS:  Blood pressure 130/70, pulse 70,  respirations 16, temperature 98.  GENERAL:  She appears as an elderly, chronically ill female in no acute  distress.  HEENT:  Atraumatic and normocephalic.  Extraocular muscles are intact.  NECK:  Supple without lymphadenopathy, thyromegaly, jugular venous  distention, or carotid bruits.  CHEST: Clear to auscultation without any increased work of breathing.  CARDIAC:  S1 and S2.  Distant heart sounds but regular.  I do not hear any  significant murmurs or gallops.  ABDOMEN:  Obese.  Active bowel sounds.  Soft, nontender.  There is no  hepatosplenomegaly.  No masses are palpated.  EXTREMITIES:  There is no clubbing noted.  Her fingers and toes are somewhat  cyanotic.  She has good peripheral pulses at the feet and radial pulse.  Her  fingers and toes are cool to the touch.  She also has normal femoral artery  pulse and popliteal pulse.   LABORATORY AND X-RAY DATA:  Laboratories are pending.   EKG  is pending.   A blood gas was done.  The PO2 was 70; PCO2 was 36.   IMPRESSION AND PLAN:  1. Failure to thrive.  It is unclear to me why she has had a progression of     inability to care for herself over the past several days.  She is a tough     historian because she has no specific complaints.  She did fall     yesterday.  I think we should keep her in the hospital as she clearly     cannot go home.  May be a nursing home candidate.  Will check appropriate     labs.  Check a CT of her head.  She does have a history of coronary     artery disease, so we will rule her out for myocardial infarction.  2. Code status.  She is a DNR/DNI.  This was discussed with her and her     daughter in detail.                                                Bruce Rexene Edison Swords, M.D. [facility]    BHS/MEDQ  D:  06/25/2004  T:  06/25/2004  Job:  [id number]   cc:   Ellin Saba., M.D.  [address] [place]  [place] [address]  Fax: [phone]

## 2011-04-14 NOTE — Cardiovascular Report (Signed)
[place]. [facility]  Patient:    [name], [name]                     MRN: [id number] Proc. Date: 10/25/00 Adm. Date:  [id number] Attending:  Learta Codding CC:         Cardiac Catheterization Laboratory  Jesse Sans. Wall, M.D. [facility]   Cardiac Catheterization  PROCEDURE: 1. Left heart catheterization. 2. Selective coronary arteriography. 3. Selective left ventriculography. 4. Distal aortography.  CARDIOLOGIST:  Arturo Morton. Riley Kill, M.D.  INDICATIONS:  The patient is a pleasant 77 year old, well known to [place].  She continues to smoke.  She restarted smoking in June.  She has had previous intervention of the circumflex and the LAD vessels.  She has had moderate disease of the right coronary artery.  She has had recurrent chest pain, and comes now to the cardiac catheterization laboratory for further evaluation.  DESCRIPTION OF PROCEDURE:  The procedure was performed from the right femoral artery using 6-French catheters.  She tolerated the procedure without any complications.  HEMODYNAMIC DATA: Central aortic pressure:  153/78. LV pressure:  155/22.  There was no gradient on pullback across the aortic valve.  ANGIOGRAPHIC DATA: 1. Left main coronary artery:  The left main coronary artery is free of    critical disease. 2. Left anterior descending coronary artery:  The left anterior descending    artery courses to the apex.  There is about 30%-40% eccentric plaquing    in the proximal vessel.  At the distal vessel, the point of prior    percutaneous intervention at a point of bifurcation, there is about    a 40% cumulative plaque that involves both limbs of the bifurcation.    The LAD and diagonal distally bifurcate into twin vessels that are    about equal in size.  High-grade critical disease is not noted. 3. Circumflex coronary artery:  The circumflex artery provides predominantly    one huge marginal branch that trifurcates distally in the area of   previous subtotal occlusion.  There is now only 30%-40% narrowing that    does not appear to be high-grade. 4. Right coronary artery:  The right coronary artery is a dominant vessel.    There is about a 30% area of proximal narrowing, followed by a long    80%-90% lesion in the midvessel.  The lesion is more severe at the    more proximal aspect of the lesion; however, extends all the way down    to a right ventricular branch.  The distal PDA and posterior lateral    systems are without critical disease.  VENTRICULOGRAPHY:  The ventriculography in the RAO projection reveals preserved global systolic function.  The ejection fraction was calculated at 55%, but appears to be slightly better than that.  DISTAL AORTOGRAPHY:  The distal aortography demonstrates mild atherosclerotic change throughout the distal aorta, with what appears to be about a 40% right renal artery stenosis.  CONCLUSIONS: 1. Preserved overall left ventricular function. 2. A 40% right renal artery stenosis. 3. Progression of the disease in the mid-right coronary artery, as    described above. 4. No evidence of significant restenosis of the left anterior descending    coronary artery or circumflex coronary artery site.  DISPOSITION:  I plan to review the films with my colleagues.  We have tentatively set her up for a percutaneous intervention.  We will pretreat her with Plavix.  The risk of target vessel  revascularization is increased, because of lesion length; however, my hope is that she will get a good long-term result, based upon the fact that she has had nice results from angioplasty in the past.  DD:  10/25/00 TD:  10/25/00 Job: [id number]

## 2011-04-14 NOTE — Op Note (Signed)
NAME:  [name], HACKBART                        ACCOUNT NO.:  [id number]   MEDICAL RECORD NO.:  [id number]                   PATIENT TYPE:  AMB   LOCATION:  DAY                                  FACILITY:  [facility]   PHYSICIAN:  Jamison Neighbor, M.D.               DATE OF BIRTH:  10-13-1922   DATE OF PROCEDURE:  08/19/2002  DATE OF DISCHARGE:                                 OPERATIVE REPORT   PREOPERATIVE DIAGNOSIS:  Mixed urinary incontinence.   POSTOPERATIVE DIAGNOSIS:  Mixed urinary incontinence.   PROCEDURE:  1. Cystoscopy.  2. TVT sling.   SURGEON:  Jamison Neighbor, M.D.   ANESTHESIA:  General.   COMPLICATIONS:  None.   DRAINS:  Foley catheter.   BRIEF HISTORY:  This 77 year old female has mixed urinary incontinence.  The  patient has definite stress incontinence with leak point pressure of only  15.  Cystoscopy revealed a wide open bladder neck with evidence of type 3  incontinence.  The patient has undergone urodynamics which does show that  she has some degree of urgency as well but does not have real well-  documented high-grade detrusor instability.  The patient was given Ditropan  which helped to control her urgency and, in fact, the Ditropan made it  somewhat hard for her to urinate, so that dose will need to be lowered.  The  patient is aware of the fact that she may require medications of this nature  for treatment of urge incontinence even if she has successful correction of  her stress incontinence.  She understands the risks and benefits of the  procedure and gave full and informed consent.   DESCRIPTION OF PROCEDURE:  After the successful induction of general  anesthesia, the patient was placed in the dorsal lithotomy position, prepped  with Betadine, and draped in the usual sterile fashion.  Careful bimanual  examination revealed that there was reasonably good support for urethra but,  as previously noted, the patient is felt to have a fixed and open  bladder  neck.  There was no real cystocele, rectocele, or enterocele.  The Foley  catheter was inserted; the bladder was drained.  The anterior vaginal mucosa  was infiltrated with local anesthetic.  While that was setting up, two small  stab incisions were made directly above the pubic bone, approximately 3  fingerbreadths apart.  The incision in the anterior vaginal mucosa was made  over the midportion of the urethra.  Flaps of mucosa were raised  bilaterally.  Dissection proceeded back to but not through the endopelvic  fascia with no direct entry into the space of Retzius.  The needle guides  were then passed from the abdominal incision down to the vaginal incision.  Cystoscopy was performed with both 12 degree and 70 degree lenses.  No  tumors or stones could be seen within the bladder.  The bladder neck was  noted to  be wide open.  There was no entry into the bladder with either of  the needle guides.  The TVT sling was then attached to the needle guides in  the usual fashion and brought from the bottom incision up to the top  incision.  The sling was positioned so that it sat directly under the mid  urethral complex.  The sling was then tensioned by placing a urethral sound  underneath the urethra and then pulling away the sheath of the TVT sling,  thus setting the tension.  There was a nice small loop underneath the  urethra, as desired.  The area was irrigated with antibiotic solution, and  the incision was closed with running suture of 2-0 Vicryl.  The sling was  cut off at the skin level at the abdominal incisions which were then closed  with Steri-Strips.  The patient had a dressing of Tegaderm placed on each.  The patient had a vaginal pack inserted with antibiotic gauze.  The patient  tolerated the procedure well and was taken to the recovery room in good  condition.  Normally a patient would be given the opportunity to void in the  recovery room and go home if able to  successfully void but given the  patient's age, a past history of problems with anesthesia, as well as her  multiple medical problems, we have elected to keep her for 23-hour  observation.                                               Jamison Neighbor, M.D.    RJE/MEDQ  D:  08/19/2002  T:  08/19/2002  Job:  [id number]   cc:   Ellin Saba., M.D.

## 2011-08-22 LAB — DIFFERENTIAL
Eosinophils Absolute: 0.3
Lymphs Abs: 1.8
Monocytes Absolute: 0.8
Monocytes Relative: 9

## 2011-08-22 LAB — URINALYSIS, ROUTINE W REFLEX MICROSCOPIC
Nitrite: NEGATIVE
Protein, ur: 100 — AB
Specific Gravity, Urine: 1.012
Urobilinogen, UA: 1

## 2011-08-22 LAB — CBC
HCT: 27.3 — ABNORMAL LOW
HCT: 31.5 — ABNORMAL LOW
Hemoglobin: 11 — ABNORMAL LOW
Hemoglobin: 9.6 — ABNORMAL LOW
MCHC: 34.8
MCHC: 34.9
MCV: 101.3 — ABNORMAL HIGH
Platelets: 197
Platelets: 208
RBC: 3.11 — ABNORMAL LOW
RDW: 12.6
RDW: 13.4
RDW: 13.5
WBC: 7.8

## 2011-08-22 LAB — BASIC METABOLIC PANEL
BUN: 15
BUN: 17
BUN: 21
CO2: 27
CO2: 28
Calcium: 8.5
Calcium: 8.7
Calcium: 8.8
GFR calc non Af Amer: 43 — ABNORMAL LOW
GFR calc non Af Amer: 50 — ABNORMAL LOW
GFR calc non Af Amer: 52 — ABNORMAL LOW
Glucose, Bld: 106 — ABNORMAL HIGH
Glucose, Bld: 107 — ABNORMAL HIGH
Glucose, Bld: 109 — ABNORMAL HIGH
Potassium: 3.6
Sodium: 137

## 2011-08-22 LAB — URINE MICROSCOPIC-ADD ON

## 2012-04-04 ENCOUNTER — Emergency Department (HOSPITAL_COMMUNITY): Payer: Medicare Other

## 2012-04-04 ENCOUNTER — Emergency Department (HOSPITAL_COMMUNITY)
Admission: EM | Admit: 2012-04-04 | Discharge: 2012-04-04 | Disposition: A | Discharge status: Skilled Nursing Facility | Payer: Medicare Other | Attending: Emergency Medicine | Admitting: Emergency Medicine

## 2012-04-04 ENCOUNTER — Encounter (HOSPITAL_COMMUNITY): Payer: Self-pay

## 2012-04-04 DIAGNOSIS — R062 Wheezing: Secondary | ICD-10-CM | POA: Insufficient documentation

## 2012-04-04 DIAGNOSIS — I509 Heart failure, unspecified: Secondary | ICD-10-CM | POA: Insufficient documentation

## 2012-04-04 DIAGNOSIS — R079 Chest pain, unspecified: Secondary | ICD-10-CM | POA: Insufficient documentation

## 2012-04-04 DIAGNOSIS — Z79899 Other long term (current) drug therapy: Secondary | ICD-10-CM | POA: Insufficient documentation

## 2012-04-04 DIAGNOSIS — R0602 Shortness of breath: Secondary | ICD-10-CM | POA: Insufficient documentation

## 2012-04-04 HISTORY — DX: Multiple fractures of ribs, unspecified side, initial encounter for closed fracture: S22.49XA

## 2012-04-04 HISTORY — DX: Gastro-esophageal reflux disease without esophagitis: K21.9

## 2012-04-04 HISTORY — DX: Depression, unspecified: F32.A

## 2012-04-04 HISTORY — DX: Fracture of one rib, unspecified side, initial encounter for closed fracture: S22.39XA

## 2012-04-04 HISTORY — DX: Major depressive disorder, single episode, unspecified: F32.9

## 2012-04-04 HISTORY — DX: Unspecified fall, initial encounter: W19.XXXA

## 2012-04-04 HISTORY — DX: Reserved for concepts with insufficient information to code with codable children: IMO0002

## 2012-04-04 HISTORY — DX: Repeated falls: R29.6

## 2012-04-04 LAB — COMPREHENSIVE METABOLIC PANEL
Albumin: 3.5 g/dL (ref 3.5–5.2)
Alkaline Phosphatase: 61 U/L (ref 39–117)
BUN: 52 mg/dL — ABNORMAL HIGH (ref 6–23)
CO2: 25 mEq/L (ref 19–32)
Chloride: 99 mEq/L (ref 96–112)
Creatinine, Ser: 2.74 mg/dL — ABNORMAL HIGH (ref 0.50–1.10)
GFR calc Af Amer: 17 mL/min — ABNORMAL LOW (ref >90)
GFR calc non Af Amer: 15 mL/min — ABNORMAL LOW (ref >90)
Glucose, Bld: 117 mg/dL — ABNORMAL HIGH (ref 70–99)
Potassium: 3.9 mEq/L (ref 3.5–5.1)
Total Bilirubin: 0.3 mg/dL (ref 0.3–1.2)

## 2012-04-04 LAB — URINALYSIS, ROUTINE W REFLEX MICROSCOPIC
Bilirubin Urine: NEGATIVE
Glucose, UA: NEGATIVE mg/dL
Ketones, ur: NEGATIVE mg/dL
Nitrite: NEGATIVE
Specific Gravity, Urine: 1.008 (ref 1.005–1.030)
pH: 5.5 (ref 5.0–8.0)

## 2012-04-04 LAB — CBC
HCT: 29.6 % — ABNORMAL LOW (ref 36.0–46.0)
Hemoglobin: 9.9 g/dL — ABNORMAL LOW (ref 12.0–15.0)
MCHC: 33.4 g/dL (ref 30.0–36.0)
RBC: 3.03 MIL/uL — ABNORMAL LOW (ref 3.87–5.11)

## 2012-04-04 LAB — CARDIAC PANEL(CRET KIN+CKTOT+MB+TROPI)
CK, MB: 3.3 ng/mL (ref 0.3–4.0)
Total CK: 61 U/L (ref 7–177)

## 2012-04-04 LAB — DIFFERENTIAL
Lymphs Abs: 1.9 10*3/uL (ref 0.7–4.0)
Monocytes Absolute: 0.7 10*3/uL (ref 0.1–1.0)
Monocytes Relative: 11 % (ref 3–12)
Neutro Abs: 3.3 10*3/uL (ref 1.7–7.7)
Neutrophils Relative %: 54 % (ref 43–77)

## 2012-04-04 LAB — URINE MICROSCOPIC-ADD ON

## 2012-04-04 LAB — PRO B NATRIURETIC PEPTIDE: Pro B Natriuretic peptide (BNP): 3305 pg/mL — ABNORMAL HIGH (ref 0–450)

## 2012-04-04 MED ORDER — LEVOFLOXACIN 250 MG PO TABS: 250.0000 mg | ORAL_TABLET | Freq: Every day | ORAL | Status: AC

## 2012-04-04 MED ORDER — LEVOFLOXACIN 500 MG PO TABS
500.0000 mg | ORAL_TABLET | Freq: Every day | ORAL | Status: DC
  Administered 2012-04-04: 500 mg via ORAL
  Filled 2012-04-04: qty 1

## 2012-04-04 MED ORDER — FUROSEMIDE 10 MG/ML IJ SOLN
20.0000 mg | Freq: Once | INTRAMUSCULAR | Status: AC
  Administered 2012-04-04: 20 mg via INTRAVENOUS
  Filled 2012-04-04: qty 2

## 2012-04-04 NOTE — ED Notes (Signed)
Per EMS- patient pick up from [facility]. Nursing staff called EMS because the patient had abnormal labs. Patient on 2L oxygen all the time with sats of 94-96%. Patient has no complaints of pain and expresses being nervous about coming to hospital. BP 136/60 HR 80 Resp 24.

## 2012-04-04 NOTE — Discharge Instructions (Signed)
Follow up with your md °

## 2012-04-04 NOTE — ED Provider Notes (Signed)
History     CSN: [id number]  Arrival date & time 04/04/12  1422   First MD Initiated Contact with Patient 04/04/12 1513      Chief Complaint  Patient presents with  . Congestive Heart Failure    (Consider location/radiation/quality/duration/timing/severity/associated sxs/prior treatment) Patient is a 78 y.o. female presenting with CHF. The history is provided by the patient (pt complains of mild sob). No language interpreter was used.  Congestive Heart Failure This is a new problem. The current episode started 6 to 12 hours ago. The problem occurs constantly. The problem has been gradually improving. Pertinent negatives include no chest pain, no abdominal pain and no headaches. The symptoms are aggravated by nothing. The symptoms are relieved by nothing. She has tried nothing for the symptoms. The treatment provided mild relief.    No past medical history on file.  No past surgical history on file.  No family history on file.  History  Substance Use Topics  . Smoking status: Never Smoker   . Smokeless tobacco: Not on file  . Alcohol Use: No    OB History    Grav Para Term Preterm Abortions TAB SAB Ect Mult Living                  Review of Systems  Constitutional: Negative for fatigue.  HENT: Negative for congestion, sinus pressure and ear discharge.   Eyes: Negative for discharge.  Respiratory: Positive for wheezing. Negative for cough.   Cardiovascular: Negative for chest pain.  Gastrointestinal: Negative for abdominal pain and diarrhea.  Genitourinary: Negative for frequency and hematuria.  Musculoskeletal: Negative for back pain.  Skin: Negative for rash.  Neurological: Negative for seizures and headaches.  Hematological: Negative.   Psychiatric/Behavioral: Negative for hallucinations.    Allergies  Amphotericin b; Bystolic; Codeine; Metoprolol tartrate; and Penicillins  Home Medications   Current Outpatient Rx  Name Route Sig Dispense Refill  .  ACETAMINOPHEN 325 MG PO TABS Oral Take 650 mg by mouth every 4 (four) hours as needed. For pain    . ALBUTEROL SULFATE (2.5 MG/3ML) 0.083% IN NEBU Nebulization Take 2.5 mg by nebulization 4 (four) times daily. For 7 days  Started 5/7    . AMLODIPINE BESYLATE 10 MG PO TABS Oral Take 10 mg by mouth daily.    . ASPIRIN 81 MG PO CHEW Oral Chew 81 mg by mouth daily.    . OS-CAL 500 + D PO Oral Take 1 tablet by mouth 2 (two) times daily.    Marland Kitchen VITAMIN D-3 PO Oral Take 1,000 Units by mouth.    . CYCLOSPORINE 0.05 % OP EMUL Both Eyes Place 1 drop into both eyes 2 (two) times daily.    Marland Kitchen ESCITALOPRAM OXALATE 10 MG PO TABS Oral Take 10 mg by mouth daily.    . FESOTERODINE FUMARATE ER 8 MG PO TB24 Oral Take 8 mg by mouth daily.    . FUROSEMIDE 40 MG PO TABS Oral Take 80 mg by mouth daily.    . FUROSEMIDE 40 MG PO TABS Oral Take 40 mg by mouth every evening.    Marland Kitchen GABAPENTIN 300 MG PO CAPS Oral Take 300 mg by mouth 3 (three) times daily.    Marland Kitchen HYDROCODONE-ACETAMINOPHEN 5-500 MG PO TABS Oral Take 2 tablets by mouth every 4 (four) hours as needed. For pain    . IPRATROPIUM BROMIDE HFA 17 MCG/ACT IN AERS Inhalation Inhale 2 puffs into the lungs 4 (four) times daily as needed. For dyspnea    .  ISOSORBIDE MONONITRATE ER 30 MG PO TB24 Oral Take 30 mg by mouth daily.    Marland Kitchen LEVALBUTEROL HCL 1.25 MG/3ML IN NEBU Nebulization Take 1.25 mg by nebulization every 4 (four) hours as needed. For shortness of breath    . LEVOTHYROXINE SODIUM 88 MCG PO TABS Oral Take 88 mcg by mouth daily.    Marland Kitchen LORAZEPAM 0.5 MG PO TABS Oral Take 0.5 mg by mouth every 6 (six) hours as needed. For anxiety    . LOSARTAN POTASSIUM 100 MG PO TABS Oral Take 100 mg by mouth daily.    Marland Kitchen LOVASTATIN 20 MG PO TABS Oral Take 20 mg by mouth at bedtime.    Marland Kitchen MOXIFLOXACIN HCL 400 MG PO TABS Oral Take 400 mg by mouth daily. For 7 days Started 5/7    . PANTOPRAZOLE SODIUM 40 MG PO TBEC Oral Take 40 mg by mouth at bedtime.    Marland Kitchen POTASSIUM CHLORIDE CRYS ER 20 MEQ  PO TBCR Oral Take 20 mEq by mouth every evening.    Marland Kitchen SALINE 0.65 % NA SOLN Nasal Place 1 spray into the nose 4 (four) times daily.    . TRAVOPROST (BAK FREE) 0.004 % OP SOLN Both Eyes Place 1 drop into both eyes at bedtime.    . TRIAMCINOLONE ACETONIDE 55 MCG/ACT NA INHA Nasal Place 2 sprays into the nose at bedtime.    . SULFAMETHOXAZOLE-TMP DS 800-160 MG PO TABS Oral Take 1 tablet by mouth 2 (two) times daily. For 5 days Started 4/30      BP 152/44  Pulse 79  Temp(Src) 98 F (36.7 C) (Oral)  Resp 20  Ht 5\' 4"  (1.626 m)  Wt 130 lb (58.968 kg)  BMI 22.31 kg/m2  SpO2 87%  Physical Exam  Constitutional: She is oriented to person, place, and time. She appears well-developed.  HENT:  Head: Normocephalic and atraumatic.  Eyes: Conjunctivae and EOM are normal. No scleral icterus.  Neck: Neck supple. No thyromegaly present.  Cardiovascular: Normal rate and regular rhythm.  Exam reveals no gallop and no friction rub.   No murmur heard. Pulmonary/Chest: No stridor. She has no wheezes. She has no rales. She exhibits no tenderness.  Abdominal: She exhibits no distension. There is no tenderness. There is no rebound.  Musculoskeletal: Normal range of motion. She exhibits no edema.  Lymphadenopathy:    She has no cervical adenopathy.  Neurological: She is oriented to person, place, and time. Coordination normal.  Skin: No rash noted. No erythema.  Psychiatric: She has a normal mood and affect. Her behavior is normal.    ED Course  Procedures (including critical care time)  Labs Reviewed  CBC - Abnormal; Notable for the following:    RBC 3.03 (*)    Hemoglobin 9.9 (*)    HCT 29.6 (*)    All other components within normal limits  COMPREHENSIVE METABOLIC PANEL - Abnormal; Notable for the following:    Glucose, Bld 117 (*)    BUN 52 (*)    Creatinine, Ser 2.74 (*)    GFR calc non Af Amer 15 (*)    GFR calc Af Amer 17 (*)    All other components within normal limits  PRO B  NATRIURETIC PEPTIDE - Abnormal; Notable for the following:    Pro B Natriuretic peptide (BNP) 3305.0 (*)    All other components within normal limits  URINALYSIS, ROUTINE W REFLEX MICROSCOPIC - Abnormal; Notable for the following:    Hgb urine dipstick TRACE (*)  Leukocytes, UA LARGE (*)    All other components within normal limits  URINE MICROSCOPIC-ADD ON - Abnormal; Notable for the following:    Bacteria, UA MANY (*)    All other components within normal limits  DIFFERENTIAL  CARDIAC PANEL(CRET KIN+CKTOT+MB+TROPI)  URINE CULTURE   Dg Chest 2 View  04/04/2012  *RADIOLOGY REPORT*  Clinical Data: Chest pain and shortness of breath.  CHEST - 2 VIEW  Comparison: Plain film chest 03/02/2008.  Findings: The patient has small bilateral pleural effusions with basilar atelectasis.  Cardiomegaly and vascular congestion noted. Remote lower thoracic compression fracture is noted.  IMPRESSION: Cardiomegaly, vascular congestion and small bilateral effusions.  Original Report Authenticated By: Bernadene Bell. Maricela Curet, M.D.     No diagnosis found.  Spoke with dr. Wylene Simmer and he will take care of pt at [facility]  MDM   Date: 04/04/2012  Rate: 80  Rhythm: normal sinus rhythm  QRS Axis: normal  Intervals: normal  ST/T Wave abnormalities: nonspecific T wave changes  Conduction Disutrbances:none  Narrative Interpretation:   Old EKG Reviewed: changes noted          Benny Lennert, MD 04/04/12 [phone]

## 2012-12-27 ENCOUNTER — Other Ambulatory Visit: Payer: Self-pay | Admitting: Internal Medicine

## 2012-12-27 DIAGNOSIS — R109 Unspecified abdominal pain: Secondary | ICD-10-CM

## 2012-12-30 ENCOUNTER — Other Ambulatory Visit: Payer: Self-pay | Admitting: Internal Medicine

## 2012-12-30 ENCOUNTER — Ambulatory Visit
Admission: RE | Admit: 2012-12-30 | Discharge: 2012-12-30 | Disposition: A | Discharge status: Home / Self Care | Payer: No Typology Code available for payment source | Source: Ambulatory Visit | Attending: Internal Medicine | Admitting: Internal Medicine

## 2012-12-30 DIAGNOSIS — R109 Unspecified abdominal pain: Secondary | ICD-10-CM

## 2013-01-20 ENCOUNTER — Encounter: Payer: Self-pay | Admitting: Physician Assistant

## 2013-01-20 ENCOUNTER — Ambulatory Visit (INDEPENDENT_AMBULATORY_CARE_PROVIDER_SITE_OTHER): Payer: Medicare Other | Admitting: Physician Assistant

## 2013-01-20 VITALS — BP 113/58 | HR 93 | Ht 64.0 in | Wt 128.0 lb

## 2013-01-20 DIAGNOSIS — I2510 Atherosclerotic heart disease of native coronary artery without angina pectoris: Secondary | ICD-10-CM | POA: Insufficient documentation

## 2013-01-20 DIAGNOSIS — I5032 Chronic diastolic (congestive) heart failure: Secondary | ICD-10-CM

## 2013-01-20 DIAGNOSIS — J449 Chronic obstructive pulmonary disease, unspecified: Secondary | ICD-10-CM | POA: Insufficient documentation

## 2013-01-20 NOTE — Progress Notes (Signed)
[address] [place], [place]  [address] Phone: [phone], Fax:  [phone]  Date:  01/20/2013   ID:  [name], DOB [birth year], MRN [id number]  PCP:  Minda Meo, MD  Primary Cardiologist:  Dr. Valera Castle     History of Present Illness: [name] is a 79 y.o. female who returns for follow up.    She has a history of CAD, status post prior interventions to the LAD, CFX and RCA, carotid stenosis, PAD, HTN, renal artery stenosis, COPD. Last LHC 11/01: proximal LAD 30-40%, distal LAD 40%, CFX 30-40%, proximal RCA 30%, mid RCA 80-90%, EF 55%.  PCI: Stent to the RCA.  Myoview 2/04: No ischemia or scar, EF 64%.   Patient last seen by Dr. Daleen Squibb in 2010.  So she returns today as a new patient.  I have reviewed her records extensively.  It appears that she has been treated for CHF with increasing doses of diuretics at [facility] with good result.  She has CKD stage III.  She tells me she has seen nephrology in the past.  She is not certain who she has seen.  She is now in AFib.  Records from [facility] indicate she is in permanent AFib.  She is felt to be a poor candidate for coumadin.  She tells me she has a hx of fairly frequent falls.  She has had recent RLQ abdominal pain.  CT scan demonstrated small pericardial effusion and she was asked to follow up here.  Of note, she tells me she is a DNR.    Denies chest pain. She has chronic dyspnea with exertion. She's probably NYHA class III. She denies orthopnea or PND. She occasionally has worsening LE edema. She denies syncope. She has occasional palpitations when she lies on her left side.  Labs (5/13):  K 3.9, creatinine 2.74, ALT 18, Hgb 9.9  Wt Readings from Last 3 Encounters:  01/20/13 128 lb (58.06 kg)  04/04/12 130 lb (58.968 kg)     Past Medical History  Diagnosis Date  . Falls   . GERD (gastroesophageal reflux disease)   . Rib fractures     left 7th and 9th  . Depression   .  Compression fracture     T11 and L1  . CAD (coronary artery disease)     a. s/p PCI to LAD, CFX, RCA in past;  b. Last LHC 11/01: proximal LAD 30-40%, distal LAD 40%, CFX 30-40%, proximal RCA 30%, mid RCA 80-90%, EF 55%.  PCI: Stent to the RCA.;  c. Myoview 2/04: No ischemia or scar, EF 64%.    . Carotid stenosis   . History of stroke   . HTN (hypertension)   . CHF (congestive heart failure)   . Hx of echocardiogram     a. Echo 11/05: EF 55-65%, borderline LVH, mild focal basal septal hypertrophy, mild AI, mild LAE, mild RAE.   Marland Kitchen PAD (peripheral artery disease)   . COPD (chronic obstructive pulmonary disease)   . Atrial fibrillation   . CKD (chronic kidney disease) stage 3, GFR 30-59 ml/min   . DNR (do not resuscitate)     Current Outpatient Prescriptions  Medication Sig Dispense Refill  . acetaminophen (TYLENOL) 325 MG tablet Take 650 mg by mouth every 4 (four) hours as needed. For pain      . albuterol (PROVENTIL) (2.5 MG/3ML) 0.083% nebulizer solution Take 2.5 mg by nebulization 4 (four) times daily. For 7 days  Started 5/7      .  amLODipine (NORVASC) 10 MG tablet Take 10 mg by mouth daily.      Marland Kitchen aspirin 81 MG chewable tablet Chew 81 mg by mouth daily.      . Calcium Carbonate-Vitamin D (OS-CAL 500 + D PO) Take 1 tablet by mouth 2 (two) times daily.      . Cholecalciferol (VITAMIN D-3 PO) Take 1,000 Units by mouth.      . cycloSPORINE (RESTASIS) 0.05 % ophthalmic emulsion Place 1 drop into both eyes 2 (two) times daily.      Marland Kitchen docusate sodium (COLACE) 100 MG capsule Take 100 mg by mouth as directed.      . escitalopram (LEXAPRO) 10 MG tablet Take 10 mg by mouth daily.      . ferrous sulfate 325 (65 FE) MG tablet Take 325 mg by mouth as directed.      . furosemide (LASIX) 40 MG tablet Take 80 mg by mouth daily.      . furosemide (LASIX) 40 MG tablet Take 40 mg by mouth every evening.      . gabapentin (NEURONTIN) 300 MG capsule Take 300 mg by mouth 2 (two) times daily.       .  GuaiFENesin (MUCINEX PO) Take by mouth.      Marland Kitchen HYDROcodone-acetaminophen (VICODIN) 5-500 MG per tablet Take 2 tablets by mouth every 4 (four) hours as needed. For pain      . ipratropium (ATROVENT HFA) 17 MCG/ACT inhaler Inhale 2 puffs into the lungs 4 (four) times daily as needed. For dyspnea      . isosorbide mononitrate (IMDUR) 30 MG 24 hr tablet Take 30 mg by mouth daily.      Marland Kitchen levalbuterol (XOPENEX) 1.25 MG/3ML nebulizer solution Take 1.25 mg by nebulization every 4 (four) hours as needed. For shortness of breath      . levothyroxine (SYNTHROID, LEVOTHROID) 88 MCG tablet Take 88 mcg by mouth daily.      Marland Kitchen LORazepam (ATIVAN) 0.5 MG tablet Take 0.5 mg by mouth every 6 (six) hours as needed. For anxiety      . losartan (COZAAR) 100 MG tablet Take 100 mg by mouth daily.      Marland Kitchen lovastatin (MEVACOR) 20 MG tablet Take 20 mg by mouth at bedtime.      . pantoprazole (PROTONIX) 40 MG tablet Take 40 mg by mouth at bedtime.      . potassium chloride SA (K-DUR,KLOR-CON) 20 MEQ tablet Take 20 mEq by mouth every evening.      . senna (SENOKOT) 8.6 MG TABS Take 1 tablet by mouth.      . sodium chloride (OCEAN) 0.65 % SOLN nasal spray Place 1 spray into the nose 4 (four) times daily.      . Travoprost, BAK Free, (TRAVATAN) 0.004 % SOLN ophthalmic solution Place 1 drop into both eyes at bedtime.      . triamcinolone (NASACORT) 55 MCG/ACT nasal inhaler Place 2 sprays into the nose at bedtime.      . sulfamethoxazole-trimethoprim (BACTRIM DS) 800-160 MG per tablet Take 1 tablet by mouth 2 (two) times daily. For 5 days Started 4/30       No current facility-administered medications for this visit.    Allergies:    Allergies  Allergen Reactions  . Amphotericin B     No reaction stated on MAR  . Bystolic (Nebivolol Hcl)     No reaction stated on MAR  . Codeine     No reaction stated on MAR  .  Metoprolol Tartrate     No reaction stated on MAR  . Penicillins     No reaction stated on MAR    Social  History:  The patient  reports that she has never smoked. She does not have any smokeless tobacco history on file. She reports that she does not drink alcohol or use illicit drugs.   ROS:  Please see the history of present illness.   All other systems reviewed and negative.   PHYSICAL EXAM: VS:  BP 113/58  Pulse 93  Ht 5\' 4"  (1.626 m)  Wt 128 lb (58.06 kg)  BMI 21.96 kg/m2  SpO2 94% Well nourished, well developed, in no acute distress HEENT: normal Neck: minimally elevated JVD at 90 Cardiac:  normal S1, S2; irregularly irregular; no murmur Lungs:  clear to auscultation bilaterally, no wheezing, rhonchi or rales Abd: soft, nontender, no hepatomegaly Ext: trace bilateral LE edema Skin: warm and dry Neuro:  CNs 2-12 intact, no focal abnormalities noted  EKG:  Atrial fibrillation, HR 86, left axis deviation, nonspecific ST-T wave changes     ASSESSMENT AND PLAN:  1. Congestive Heart Failure: I suspect she has diastolic CHF worsened by chronic kidney disease. It has been many years since her ejection fraction was assessed. She also has evidence of a small pericardial effusion on recent abdominal/pelvic CT. I will set her up for an echocardiogram. Volume management is limited by renal function. I will ask [facility] to send [place] a copy of her most recent basic metabolic panel. At this point, her volume appears to be fairly stable. She's on a fairly large dose of diuretics already. I will make no changes. 2. Coronary Artery Disease: She denies any symptoms of angina. She has chronic kidney disease. She is a DO NOT RESUSCITATE. At this point, she does not require any further cardiac testing. She would not be a good candidate for invasive testing such as cardiac catheterization. Routine stress testing at this point would not be helpful.  Continue aspirin and statin. 3. Atrial Fibrillation: Duration is unknown. Records from [facility] indicate that this is permanent. In review of her records, she  had a visit to the emergency room in 5/13. ECG at that time showed normal sinus rhythm.  CHADS2=5.  she would benefit from anticoagulation.  However, records indicate that she has a significant fall risk and is not a candidate for Coumadin. In light of this, she will be continued on aspirin only.  Rate is controlled. I will request her most recent TSH. 4. Chronic Kidney Disease:  As noted, I will request most recent BMET.  I will also ask for her most recent CBC. 5. COPD:  Stable. 6. Hypertension:  Controlled.  Continue current therapy.  7. Disposition:  Follow up with Dr. Daleen Squibb or me in 1 month.   Luna Glasgow, PA-C  12:42 PM 01/20/2013

## 2013-01-20 NOTE — Patient Instructions (Addendum)
Your physician has requested that you have an echocardiogram. Echocardiography is a painless test that uses sound waves to create images of your heart. It provides your doctor with information about the size and shape of your heart and how well your heart's chambers and valves are working. This procedure takes approximately one hour. There are no restrictions for this procedure.  PLEASE FOLLOW UP WITH DR. WALL IN ABOUT 1 MONTH  NO MEDICATION CHANGES WERE MADE TODAY

## 2013-01-21 ENCOUNTER — Encounter: Payer: Self-pay | Admitting: Physician Assistant

## 2013-01-29 ENCOUNTER — Ambulatory Visit (HOSPITAL_COMMUNITY): Payer: Medicare Other | Attending: Cardiology | Admitting: Radiology

## 2013-01-29 DIAGNOSIS — I4891 Unspecified atrial fibrillation: Secondary | ICD-10-CM | POA: Insufficient documentation

## 2013-01-29 DIAGNOSIS — I5032 Chronic diastolic (congestive) heart failure: Secondary | ICD-10-CM | POA: Insufficient documentation

## 2013-01-29 NOTE — Progress Notes (Signed)
Echocardiogram performed.  

## 2013-01-31 ENCOUNTER — Telehealth: Payer: Self-pay | Admitting: *Deleted

## 2013-01-31 ENCOUNTER — Encounter: Payer: Self-pay | Admitting: Physician Assistant

## 2013-01-31 NOTE — Telephone Encounter (Signed)
Message copied by Tarri Fuller on Fri Jan 31, 2013 10:50 AM ------      Message from: [place], [place] T      Created: Fri Jan 31, 2013 10:33 AM       EF is still normal      Mod Pul HTN is related to COPD       Recent labs with good creatinine and echo with small effusion      Chart indicates that she currently takes Lasix 80 in A and 40 in P.      Increase Lasix to 80 bid x [redacted] week along with K+ 20 bid.      Then resume old dose of Lasix and K+.      Check BMET and BNP in 2 weeks.      Tereso Newcomer, PA-C  10:33 AM 01/31/2013 ------

## 2013-01-31 NOTE — Telephone Encounter (Signed)
s/w pt's daughter due to pt is in [facility]. daughter notified about echo results and lasix/K+ dose changes, w/BMET, BNP 2 weeks, daughter said thank you for letteing her know. I will call [facility] as well, fax over results w/SW, PA orders . [place], [facility] aware of orders of increased lasix and K+ and repeat bmet, bnp 2 weeks on echo results per Tereso Newcomer, [facility], will fax over to [phone] today.

## 2013-02-13 ENCOUNTER — Ambulatory Visit (INDEPENDENT_AMBULATORY_CARE_PROVIDER_SITE_OTHER): Payer: Medicare Other | Admitting: Physician Assistant

## 2013-02-13 ENCOUNTER — Encounter: Payer: Self-pay | Admitting: Physician Assistant

## 2013-02-13 VITALS — BP 98/50 | Ht 60.0 in | Wt 126.0 lb

## 2013-02-13 LAB — BASIC METABOLIC PANEL
BUN: 36 mg/dL — ABNORMAL HIGH (ref 6–23)
CO2: 28 mEq/L (ref 19–32)
Glucose, Bld: 99 mg/dL (ref 70–99)
Potassium: 4.5 mEq/L (ref 3.5–5.1)
Sodium: 136 mEq/L (ref 135–145)

## 2013-02-13 NOTE — Patient Instructions (Addendum)
Your physician has recommended you make the following change in your medication: decrease Norvasc to 5 mg daily  Your physician recommends that you return for lab work in: today  Your physician recommends that you schedule a follow-up appointment in: 3 months on May 07, 2013 at 2:00 with Dr. Daleen Squibb

## 2013-02-13 NOTE — Progress Notes (Signed)
[address] [place], [place]  [address] Phone: [phone], Fax:  [phone]  Date:  02/13/2013   ID:  [name], DOB [birth year], MRN [id number]  PCP:  Minda Meo, MD  Primary Cardiologist:  Dr. Valera Castle     History of Present Illness: [name] is a 79 y.o. female who returns for follow up.    She has a hx of CAD, status post prior interventions to the LAD, CFX and RCA, carotid stenosis, PAD, HTN, renal artery stenosis, COPD. Last LHC 11/01: proximal LAD 30-40%, distal LAD 40%, CFX 30-40%, proximal RCA 30%, mid RCA 80-90%, EF 55%.  PCI: Stent to the RCA.  Myoview 2/04: No ischemia or scar, EF 64%.   I saw her several weeks ago in follow up after having been treated for CHF with increasing doses of diuretics at [facility] with good result.  She has CKD stage III and has seen nephrology in the past.  She was noted to be in AFib, which was new since her last visit here.  Records from [facility] indicated she was in permanent AFib.  She has been felt to be a poor candidate for coumadin.  She noted a hx of fairly frequent falls.  She had had recent RLQ abdominal pain and a CT scan demonstrated a small pericardial effusion.  She is a DNR.  I set her up for an echocardiogram which demonstrated normal ejection fraction, indeterminate diastolic function, moderate bi-atrial enlargement and moderate pulmonary hypertension and a small post effusion.  Since last seen, she is stable.  Denies orthopnea, PND.  LE edema is better.  No chest pain.  She has chronic DOE.  She is NYHA Class III.  No syncope.   Labs (5/13):  K 3.9, creatinine 2.74, ALT 18, Hgb 9.9 Labs (2/14):  K 4.2, creatinine 1.04, Hgb 10.5, Alb 3.5, ALT 16  Wt Readings from Last 3 Encounters:  02/13/13 126 lb (57.153 kg)  01/20/13 128 lb (58.06 kg)  04/04/12 130 lb (58.968 kg)     Past Medical History  Diagnosis Date  . Falls   . GERD (gastroesophageal reflux disease)   . Rib  fractures     left 7th and 9th  . Depression   . Compression fracture     T11 and L1  . CAD (coronary artery disease)     a. s/p PCI to LAD, CFX, RCA in past;  b. Last LHC 11/01: proximal LAD 30-40%, distal LAD 40%, CFX 30-40%, proximal RCA 30%, mid RCA 80-90%, EF 55%.  PCI: Stent to the RCA.;  c. Myoview 2/04: No ischemia or scar, EF 64%.    . Carotid stenosis   . History of stroke   . HTN (hypertension)   . CHF (congestive heart failure)   . Hx of echocardiogram     a. Echo 11/05: EF 55-65%, borderline LVH, mild focal basal septal hypertrophy, mild AI, mild LAE, mild RAE;   b.  Echo 3/14:  EF 60-65%, normal wall motion, mild AI, MAC, trivial MR, mod BAE, mild to mod TR, PASP 50-54 (mod Pul HTN), small post eff   . PAD (peripheral artery disease)   . COPD (chronic obstructive pulmonary disease)   . Atrial fibrillation   . CKD (chronic kidney disease) stage 3, GFR 30-59 ml/min   . DNR (do not resuscitate)     Current Outpatient Prescriptions  Medication Sig Dispense Refill  . acetaminophen (TYLENOL) 325 MG tablet Take 650 mg by mouth  every 4 (four) hours as needed. For pain      . albuterol (PROVENTIL) (2.5 MG/3ML) 0.083% nebulizer solution Take 2.5 mg by nebulization 4 (four) times daily. For 7 days  Started 5/7      . amLODipine (NORVASC) 10 MG tablet Take 10 mg by mouth daily.      Marland Kitchen aspirin 81 MG chewable tablet Chew 81 mg by mouth daily.      . Calcium Carbonate-Vitamin D (OS-CAL 500 + D PO) Take 1 tablet by mouth 2 (two) times daily.      . Cholecalciferol (VITAMIN D-3 PO) Take 1,000 Units by mouth.      . cycloSPORINE (RESTASIS) 0.05 % ophthalmic emulsion Place 1 drop into both eyes 2 (two) times daily.      Marland Kitchen docusate sodium (COLACE) 100 MG capsule Take 100 mg by mouth as directed.      . escitalopram (LEXAPRO) 10 MG tablet Take 10 mg by mouth daily.      . ferrous sulfate 325 (65 FE) MG tablet Take 325 mg by mouth as directed.      . furosemide (LASIX) 40 MG tablet Take 80 mg  by mouth daily.      . furosemide (LASIX) 40 MG tablet Take 40 mg by mouth every evening.      . gabapentin (NEURONTIN) 300 MG capsule Take 300 mg by mouth 2 (two) times daily.       . GuaiFENesin (MUCINEX PO) Take by mouth.      Marland Kitchen HYDROcodone-acetaminophen (VICODIN) 5-500 MG per tablet Take 2 tablets by mouth every 4 (four) hours as needed. For pain      . ipratropium (ATROVENT HFA) 17 MCG/ACT inhaler Inhale 2 puffs into the lungs 4 (four) times daily as needed. For dyspnea      . isosorbide mononitrate (IMDUR) 30 MG 24 hr tablet Take 30 mg by mouth daily.      Marland Kitchen levalbuterol (XOPENEX) 1.25 MG/3ML nebulizer solution Take 1.25 mg by nebulization every 4 (four) hours as needed. For shortness of breath      . levothyroxine (SYNTHROID, LEVOTHROID) 88 MCG tablet Take 88 mcg by mouth daily.      Marland Kitchen LORazepam (ATIVAN) 0.5 MG tablet Take 0.5 mg by mouth every 6 (six) hours as needed. For anxiety      . losartan (COZAAR) 100 MG tablet Take 100 mg by mouth daily.      Marland Kitchen lovastatin (MEVACOR) 20 MG tablet Take 20 mg by mouth at bedtime.      . pantoprazole (PROTONIX) 40 MG tablet Take 40 mg by mouth at bedtime.      . potassium chloride SA (K-DUR,KLOR-CON) 20 MEQ tablet Take 20 mEq by mouth every evening.      . senna (SENOKOT) 8.6 MG TABS Take 1 tablet by mouth.      . sodium chloride (OCEAN) 0.65 % SOLN nasal spray Place 1 spray into the nose 4 (four) times daily.      Marland Kitchen sulfamethoxazole-trimethoprim (BACTRIM DS) 800-160 MG per tablet Take 1 tablet by mouth 2 (two) times daily. For 5 days Started 4/30      . Travoprost, BAK Free, (TRAVATAN) 0.004 % SOLN ophthalmic solution Place 1 drop into both eyes at bedtime.      . triamcinolone (NASACORT) 55 MCG/ACT nasal inhaler Place 2 sprays into the nose at bedtime.       No current facility-administered medications for this visit.    Allergies:    Allergies  Allergen Reactions  . Amphotericin B     No reaction stated on MAR  . Bystolic (Nebivolol Hcl)       No reaction stated on MAR  . Codeine     No reaction stated on MAR  . Metoprolol Tartrate     No reaction stated on MAR  . Penicillins     No reaction stated on MAR    Social History:  The patient  reports that she has never smoked. She does not have any smokeless tobacco history on file. She reports that she does not drink alcohol or use illicit drugs.   ROS:  Please see the history of present illness. She notes occasional nausea.  All other systems reviewed and negative.   PHYSICAL EXAM: VS:  BP 98/50  Ht 5' (1.524 m)  Wt 126 lb (57.153 kg)  BMI 24.61 kg/m2 Well nourished, well developed, in no acute distress HEENT: normal Neck: no JVD at 90 Cardiac:  normal S1, S2; irregularly irregular; no murmur Lungs:  clear to auscultation bilaterally, no wheezing, rhonchi or rales Abd: soft, nontender, no hepatomegaly Ext: no edema; bilateral LE compression stockings in place Skin: warm and dry Neuro:  CNs 2-12 intact, no focal abnormalities noted  EKG:  Atrial fibrillation, HR 80      ASSESSMENT AND PLAN:  1. Chronic Diastolic CHF:  Volume appears stable.  Continue current Rx.  Check BMET and BNP today. 2. Coronary Artery Disease:  She denies any symptoms of angina.  Continue aspirin and statin. 3. Atrial Fibrillation:  CHADS2=5.  However, records indicate that she has a significant fall risk and has been deemed a poor candidate for Coumadin. In light of this, she will be continued on aspirin only.  Rate is controlled.  Check TSH. 4. Chronic Kidney Disease:  Check follow up BMET. 5. COPD:  Stable.  Suspect a lot of her dyspnea is related to this in addition to CHF. 6. Hypertension:  BP low.  Reduce Norvasc to 5 mg QD.  7. Disposition:  Follow up with Dr. Daleen Squibb in 3 mos.   Signed, Tereso Newcomer, PA-C  1:56 PM 02/13/2013

## 2013-02-14 ENCOUNTER — Other Ambulatory Visit: Payer: Self-pay | Admitting: *Deleted

## 2013-02-14 DIAGNOSIS — E039 Hypothyroidism, unspecified: Secondary | ICD-10-CM

## 2013-02-17 ENCOUNTER — Telehealth: Payer: Self-pay | Admitting: *Deleted

## 2013-02-17 ENCOUNTER — Ambulatory Visit (INDEPENDENT_AMBULATORY_CARE_PROVIDER_SITE_OTHER): Payer: Medicare Other | Admitting: *Deleted

## 2013-02-17 DIAGNOSIS — E039 Hypothyroidism, unspecified: Secondary | ICD-10-CM

## 2013-02-17 LAB — T4, FREE: Free T4: 1.13 ng/dL (ref 0.60–1.60)

## 2013-02-17 LAB — TSH: TSH: 0.37 u[IU]/mL (ref 0.35–5.50)

## 2013-02-17 LAB — T3, FREE: T3, Free: 2.1 pg/mL — ABNORMAL LOW (ref 2.3–4.2)

## 2013-02-17 NOTE — Telephone Encounter (Signed)
s/w pt's daughter Mindi Junker about lab results and recommendations, daughter said thank you. I will call and fax # [phone]  these to [facility]

## 2013-02-17 NOTE — Telephone Encounter (Signed)
Message copied by Tarri Fuller on Mon Feb 17, 2013  5:47 PM ------      Message from: [place], [place] T      Created: Mon Feb 17, 2013  5:29 PM       TSH ok (low end of normal range)      Free T4 ok      Free T3 slightly low      Not sure what to make of this.  Probably ok and no further workup indicated.      I would like her to follow up with her PCP to investigate this further.      Please fax copy of labs to her PCP and make sure she gets follow up for this.      Tereso Newcomer, PA-C  5:29 PM 02/17/2013 ------

## 2013-05-07 ENCOUNTER — Ambulatory Visit: Payer: Medicare Other | Admitting: Cardiology

## 2013-05-21 ENCOUNTER — Encounter: Payer: Self-pay | Admitting: Nurse Practitioner

## 2013-05-21 ENCOUNTER — Ambulatory Visit (INDEPENDENT_AMBULATORY_CARE_PROVIDER_SITE_OTHER): Payer: Medicare Other | Admitting: Nurse Practitioner

## 2013-05-21 VITALS — BP 110/60 | HR 88 | Ht 64.0 in | Wt 123.4 lb

## 2013-05-21 DIAGNOSIS — I5032 Chronic diastolic (congestive) heart failure: Secondary | ICD-10-CM

## 2013-05-21 DIAGNOSIS — I4891 Unspecified atrial fibrillation: Secondary | ICD-10-CM

## 2013-05-21 MED ORDER — AMLODIPINE BESYLATE 10 MG PO TABS: 5.0000 mg | ORAL_TABLET | Freq: Every day | ORAL | Status: DC

## 2013-05-21 NOTE — Patient Instructions (Addendum)
Please stop the Norvasc  Monitor her blood pressure 3 times a week and record. Please call us for consistent readings less than 130 systolic.   See Lawson Fiscal in 4 months  Call the [facility] office at [phone] if you have any questions, problems or concerns.

## 2013-05-21 NOTE — Progress Notes (Signed)
[name] Date of Birth: 04-05-1922 Medical Record #[id number]  History of Present Illness: [name] is seen back today for a 3 month check. She is a patient of Dr. Vern Claude. She has known CAD with prior interventions to the LAD, LCX and RCA, carotid stenosis, PAD, HTN, RAS, COPD. LHC 11/01: proximal LAD 30-40%, distal LAD 40%, CFX 30-40%, proximal RCA 30%, mid RCA 80-90%, EF 55%. PCI: Stent to the RCA. Myoview 2/04: No ischemia or scar, EF 64%. Her other issues include CHF, CKD, chronic atrial fib and a poor candidate for coumadin. Multiple falls. She has had a small pericardial effusion. Last echo in March of 2014 with normal EF, moderate pulmonary HTN, mild AI, MAD, trivial MR and moderate BAE, with mild to moderate TR. She is a DNR. Resides at [facility].   Last seen back in March by Tereso Newcomer, PA and was doing ok. BP was low and her Norvasc was cut back.   Comes back today. Here alone. Doing ok. In a wheelchair. BP is still on the low side. No chest pain. Says her breathing is fair. Little swelling. Notes that she has continued to fall and fell today. Says that everytime she stands up, she "goes backwards". No syncope. Labs are done at the facility. Overall, she thinks she is ok.     Current Outpatient Prescriptions  Medication Sig Dispense Refill  . acetaminophen (TYLENOL) 325 MG tablet Take 650 mg by mouth every 4 (four) hours as needed. For pain      . aspirin 81 MG chewable tablet Chew 81 mg by mouth daily.      . Calcium Carbonate-Vitamin D (OS-CAL 500 + D PO) Take 1 tablet by mouth 2 (two) times daily.      . Cholecalciferol (VITAMIN D-3 PO) Take 1,000 Units by mouth.      . Cranberry 475 MG CAPS Take 475 mg by mouth 2 (two) times daily.      . Cranberry-Vitamin C-Inulin (UTI-STAT) LIQD Take 30 mLs by mouth daily.      . cycloSPORINE (RESTASIS) 0.05 % ophthalmic emulsion Place 1 drop into both eyes 2 (two) times daily.      Marland Kitchen docusate sodium (COLACE) 100 MG capsule Take 100  mg by mouth as directed.      . escitalopram (LEXAPRO) 10 MG tablet Take 10 mg by mouth daily.      . furosemide (LASIX) 40 MG tablet Take 80 mg by mouth daily.      . furosemide (LASIX) 40 MG tablet Take 40 mg by mouth every evening.      . gabapentin (NEURONTIN) 300 MG capsule Take 300 mg by mouth daily.       . GuaiFENesin (MUCINEX PO) Take by mouth.      Marland Kitchen HYDROcodone-acetaminophen (NORCO) 10-325 MG per tablet Take 1 tablet by mouth every morning.      Marland Kitchen ipratropium (ATROVENT HFA) 17 MCG/ACT inhaler Inhale 2 puffs into the lungs 4 (four) times daily as needed. For dyspnea      . isosorbide mononitrate (IMDUR) 30 MG 24 hr tablet Take 30 mg by mouth daily.      Marland Kitchen levalbuterol (XOPENEX) 1.25 MG/3ML nebulizer solution Take 1.25 mg by nebulization every 4 (four) hours as needed. For shortness of breath      . levothyroxine (SYNTHROID, LEVOTHROID) 88 MCG tablet Take 88 mcg by mouth daily.      . Liniments (ORTHOGEL) GEL Apply topically 3 (three) times daily.      Marland Kitchen  LORazepam (ATIVAN) 0.5 MG tablet Take 0.5 mg by mouth every 6 (six) hours as needed. For anxiety      . losartan (COZAAR) 100 MG tablet Take 100 mg by mouth daily.      . magnesium hydroxide (MILK OF MAGNESIA) 400 MG/5ML suspension Take 30 mLs by mouth daily as needed for constipation.      . OXYGEN-HELIUM IN Inhale 2 L into the lungs as needed.      . pantoprazole (PROTONIX) 40 MG tablet Take 40 mg by mouth at bedtime.      . polyethylene glycol (MIRALAX / GLYCOLAX) packet Take 17 g by mouth daily.      . potassium chloride SA (K-DUR,KLOR-CON) 20 MEQ tablet Take 20 mEq by mouth every evening.      . promethazine (PHENERGAN) 25 MG tablet Take 25 mg by mouth every 6 (six) hours as needed for nausea.      Marland Kitchen senna (SENOKOT) 8.6 MG TABS Take 1 tablet by mouth.      . simethicone (MYLICON) 125 MG chewable tablet Chew 125 mg by mouth every 6 (six) hours as needed for flatulence.      . sodium chloride (OCEAN) 0.65 % SOLN nasal spray Place 1  spray into the nose 4 (four) times daily.      Marland Kitchen sulfamethoxazole-trimethoprim (BACTRIM DS) 800-160 MG per tablet Take 1 tablet by mouth 2 (two) times daily. For 5 days Started 4/30      . Travoprost, BAK Free, (TRAVATAN) 0.004 % SOLN ophthalmic solution Place 1 drop into both eyes at bedtime.      . triamcinolone (NASACORT) 55 MCG/ACT nasal inhaler Place 2 sprays into the nose at bedtime.       No current facility-administered medications for this visit.    Allergies  Allergen Reactions  . Amphotericin B     No reaction stated on MAR  . Bystolic (Nebivolol Hcl)     No reaction stated on MAR  . Codeine     No reaction stated on MAR  . Metoprolol Tartrate     No reaction stated on MAR  . Penicillins     No reaction stated on [facility]    Past Medical History  Diagnosis Date  . Falls   . GERD (gastroesophageal reflux disease)   . Rib fractures     left 7th and 9th  . Depression   . Compression fracture     T11 and L1  . CAD (coronary artery disease)     a. s/p PCI to LAD, CFX, RCA in past;  b. Last LHC 11/01: proximal LAD 30-40%, distal LAD 40%, CFX 30-40%, proximal RCA 30%, mid RCA 80-90%, EF 55%.  PCI: Stent to the RCA.;  c. Myoview 2/04: No ischemia or scar, EF 64%.    . Carotid stenosis   . History of stroke   . HTN (hypertension)   . CHF (congestive heart failure)   . Hx of echocardiogram     a. Echo 11/05: EF 55-65%, borderline LVH, mild focal basal septal hypertrophy, mild AI, mild LAE, mild RAE;   b.  Echo 3/14:  EF 60-65%, normal wall motion, mild AI, MAC, trivial MR, mod BAE, mild to mod TR, PASP 50-54 (mod Pul HTN), small post eff   . PAD (peripheral artery disease)   . COPD (chronic obstructive pulmonary disease)   . Atrial fibrillation   . CKD (chronic kidney disease) stage 3, GFR 30-59 ml/min   . DNR (do not resuscitate)  Past Surgical History  Procedure Laterality Date  . Back surgery      on multiple occasions.     History  Smoking status  . Never  Smoker   Smokeless tobacco  . Not on file    History  Alcohol Use No    Family History  Problem Relation Age of Onset  . Family history unknown: Yes    Review of Systems: The review of systems is per the HPI.  All other systems were reviewed and are negative.  Physical Exam: BP 110/60  Pulse 88  Ht 5\' 4"  (1.626 m)  Wt 123 lb 6.4 oz (55.974 kg)  BMI 21.17 kg/m2 Patient is very pleasant and in no acute distress. She is in a wheelchair. Skin is warm and dry. Color is normal.  HEENT is unremarkable. Normocephalic/atraumatic. PERRL. Sclera are nonicteric. Neck is supple. No masses. No JVD. Lungs are clear. Cardiac exam shows an irregular rhythm. Rate is ok. Abdomen is soft. Extremities are without edema. Gait and ROM are intact. No gross neurologic deficits noted.  LABORATORY DATA: Lab Results  Component Value Date   WBC 6.1 04/04/2012   HGB 9.9* 04/04/2012   HCT 29.6* 04/04/2012   PLT 211 04/04/2012   GLUCOSE 99 02/13/2013   CHOL 131 10/05/2006   HDL 31.7* 10/05/2006   LDLDIRECT 57.6 10/05/2006   ALT 18 04/04/2012   AST 26 04/04/2012   NA 136 02/13/2013   K 4.5 02/13/2013   CL 100 02/13/2013   CREATININE 1.5* 02/13/2013   BUN 36* 02/13/2013   CO2 28 02/13/2013   TSH 0.37 02/17/2013   Echo Study Conclusions from March 2014  - Left ventricle: The cavity size was normal. Wall thickness was normal. Systolic function was normal. The estimated ejection fraction was in the range of 60% to 65%. Indeterminant diastolic function (atrial fibrillation). Wall motion was normal; there were no regional wall motion abnormalities. - Aortic valve: Trileaflet; mildly calcified leaflets. There was no stenosis. Mild regurgitation. - Mitral valve: Mildly calcified annulus. Trivial regurgitation. - Left atrium: The atrium was moderately dilated. - Right ventricle: The cavity size was normal. Systolic function was normal. - Right atrium: The atrium was moderately dilated. - Tricuspid valve: Mild-moderate  regurgitation. Peak RV-RA gradient:79mm Hg (S). - Pulmonary arteries: PA systolic pressure 50-54 mmHg. - Systemic veins: IVC measured 2.1 cm with normal respirophasic variation, suggesting RA pressure 6-10 mmHg. - Pericardium, extracardiac: Small posterior pericardial effusion.    Assessment / Plan: 1. Chronic diastolic CHF - weight is down a little. Seems compensated.   2. Chronic atrial fib - managed with rate control. No anticoagulation due to falls.   3. CAD - prior PCI's - stable at this time - would favor conservative management.   4. CKD  5. HTN - Blood pressure still on the low side of normal. Will stop her Norvasc. I will see her back in 4 months.   Patient is agreeable to this plan and will call if any problems develop in the interim.   Rosalio Macadamia, RN, ANP-C [facility] [address] [place], [place]  [address]

## 2013-06-02 ENCOUNTER — Encounter: Payer: Self-pay | Admitting: Internal Medicine

## 2013-06-07 ENCOUNTER — Emergency Department (HOSPITAL_COMMUNITY)
Admission: EM | Admit: 2013-06-07 | Discharge: 2013-06-07 | Disposition: A | Discharge status: Home / Self Care | Payer: Medicare Other | Attending: Emergency Medicine | Admitting: Emergency Medicine

## 2013-06-07 ENCOUNTER — Encounter (HOSPITAL_COMMUNITY): Payer: Self-pay | Admitting: *Deleted

## 2013-06-07 DIAGNOSIS — I4891 Unspecified atrial fibrillation: Secondary | ICD-10-CM | POA: Insufficient documentation

## 2013-06-07 DIAGNOSIS — F3289 Other specified depressive episodes: Secondary | ICD-10-CM | POA: Insufficient documentation

## 2013-06-07 DIAGNOSIS — Z79899 Other long term (current) drug therapy: Secondary | ICD-10-CM | POA: Insufficient documentation

## 2013-06-07 DIAGNOSIS — I509 Heart failure, unspecified: Secondary | ICD-10-CM | POA: Insufficient documentation

## 2013-06-07 DIAGNOSIS — J4489 Other specified chronic obstructive pulmonary disease: Secondary | ICD-10-CM | POA: Insufficient documentation

## 2013-06-07 DIAGNOSIS — K219 Gastro-esophageal reflux disease without esophagitis: Secondary | ICD-10-CM | POA: Insufficient documentation

## 2013-06-07 DIAGNOSIS — Z880 Allergy status to penicillin: Secondary | ICD-10-CM | POA: Insufficient documentation

## 2013-06-07 DIAGNOSIS — I2510 Atherosclerotic heart disease of native coronary artery without angina pectoris: Secondary | ICD-10-CM | POA: Insufficient documentation

## 2013-06-07 DIAGNOSIS — I739 Peripheral vascular disease, unspecified: Secondary | ICD-10-CM | POA: Insufficient documentation

## 2013-06-07 DIAGNOSIS — N183 Chronic kidney disease, stage 3 unspecified: Secondary | ICD-10-CM | POA: Insufficient documentation

## 2013-06-07 DIAGNOSIS — Z8781 Personal history of (healed) traumatic fracture: Secondary | ICD-10-CM | POA: Insufficient documentation

## 2013-06-07 DIAGNOSIS — F329 Major depressive disorder, single episode, unspecified: Secondary | ICD-10-CM | POA: Insufficient documentation

## 2013-06-07 DIAGNOSIS — Z8673 Personal history of transient ischemic attack (TIA), and cerebral infarction without residual deficits: Secondary | ICD-10-CM | POA: Insufficient documentation

## 2013-06-07 DIAGNOSIS — J449 Chronic obstructive pulmonary disease, unspecified: Secondary | ICD-10-CM | POA: Insufficient documentation

## 2013-06-07 DIAGNOSIS — I129 Hypertensive chronic kidney disease with stage 1 through stage 4 chronic kidney disease, or unspecified chronic kidney disease: Secondary | ICD-10-CM | POA: Insufficient documentation

## 2013-06-07 DIAGNOSIS — R51 Headache: Secondary | ICD-10-CM | POA: Insufficient documentation

## 2013-06-07 DIAGNOSIS — R040 Epistaxis: Secondary | ICD-10-CM

## 2013-06-07 DIAGNOSIS — Z7982 Long term (current) use of aspirin: Secondary | ICD-10-CM | POA: Insufficient documentation

## 2013-06-07 DIAGNOSIS — Z8679 Personal history of other diseases of the circulatory system: Secondary | ICD-10-CM | POA: Insufficient documentation

## 2013-06-07 DIAGNOSIS — Z9181 History of falling: Secondary | ICD-10-CM | POA: Insufficient documentation

## 2013-06-07 MED ORDER — AMLODIPINE BESYLATE 5 MG PO TABS
5.0000 mg | ORAL_TABLET | Freq: Every day | ORAL | Status: DC
  Administered 2013-06-07: 5 mg via ORAL
  Filled 2013-06-07: qty 1

## 2013-06-07 MED ORDER — ACETAMINOPHEN 325 MG PO TABS
650.0000 mg | ORAL_TABLET | Freq: Once | ORAL | Status: AC
  Administered 2013-06-07: 650 mg via ORAL
  Filled 2013-06-07: qty 2

## 2013-06-07 MED ORDER — OXYCODONE-ACETAMINOPHEN 5-325 MG PO TABS
1.0000 | ORAL_TABLET | Freq: Once | ORAL | Status: AC
  Administered 2013-06-07: 1 via ORAL
  Filled 2013-06-07: qty 1

## 2013-06-07 MED ORDER — ONDANSETRON 4 MG PO TBDP
4.0000 mg | ORAL_TABLET | Freq: Once | ORAL | Status: AC
  Administered 2013-06-07: 4 mg via ORAL
  Filled 2013-06-07: qty 1

## 2013-06-07 NOTE — ED Notes (Signed)
Report called to [facility]. Spoke with Samson Frederic

## 2013-06-07 NOTE — ED Provider Notes (Signed)
History    CSN: [id number] Arrival date & time 06/07/13  4098  First MD Initiated Contact with Patient 06/07/13 1028     Chief Complaint  Patient presents with  . Epistaxis   (Consider location/radiation/quality/duration/timing/severity/associated sxs/prior Treatment) HPI Comments: Patient presents with a nosebleed. She states that started about 45 minutes to hour prior to arrival. She's noticed bleeding out of the right nares. She was given Afrin nasal spray by EMS but still has some oozing from the nearest. She complains of a frontal headache. She had a history of a nosebleed one time in the past and saw Dr. Ezzard Standing.    Patient is a 79 y.o. female presenting with nosebleeds.  Epistaxis Associated symptoms: headaches   Associated symptoms: no congestion, no cough, no dizziness, no fever and no sneezing    Past Medical History  Diagnosis Date  . Falls   . GERD (gastroesophageal reflux disease)   . Rib fractures     left 7th and 9th  . Depression   . Compression fracture     T11 and L1  . CAD (coronary artery disease)     a. s/p PCI to LAD, CFX, RCA in past;  b. Last LHC 11/01: proximal LAD 30-40%, distal LAD 40%, CFX 30-40%, proximal RCA 30%, mid RCA 80-90%, EF 55%.  PCI: Stent to the RCA.;  c. Myoview 2/04: No ischemia or scar, EF 64%.    . Carotid stenosis   . History of stroke   . HTN (hypertension)   . CHF (congestive heart failure)   . Hx of echocardiogram     a. Echo 11/05: EF 55-65%, borderline LVH, mild focal basal septal hypertrophy, mild AI, mild LAE, mild RAE;   b.  Echo 3/14:  EF 60-65%, normal wall motion, mild AI, MAC, trivial MR, mod BAE, mild to mod TR, PASP 50-54 (mod Pul HTN), small post eff   . PAD (peripheral artery disease)   . COPD (chronic obstructive pulmonary disease)   . Atrial fibrillation   . CKD (chronic kidney disease) stage 3, GFR 30-59 ml/min   . DNR (do not resuscitate)    Past Surgical History  Procedure Laterality Date  . Back surgery       on multiple occasions.    History reviewed. No pertinent family history. History  Substance Use Topics  . Smoking status: Never Smoker   . Smokeless tobacco: Not on file  . Alcohol Use: No   OB History   Grav Para Term Preterm Abortions TAB SAB Ect Mult Living                 Review of Systems  Constitutional: Negative for fever, chills, diaphoresis and fatigue.  HENT: Positive for nosebleeds. Negative for congestion, rhinorrhea and sneezing.   Eyes: Negative.   Respiratory: Negative for cough, chest tightness and shortness of breath.   Cardiovascular: Negative for chest pain and leg swelling.  Gastrointestinal: Negative for nausea, vomiting, abdominal pain, diarrhea and blood in stool.  Genitourinary: Negative for frequency, hematuria, flank pain and difficulty urinating.  Musculoskeletal: Negative for back pain and arthralgias.  Skin: Negative for rash.  Neurological: Positive for headaches. Negative for dizziness, speech difficulty, weakness and numbness.    Allergies  Amphotericin b; Bystolic; Codeine; Metoprolol tartrate; and Penicillins  Home Medications   Current Outpatient Rx  Name  Route  Sig  Dispense  Refill  . acetaminophen (TYLENOL) 325 MG tablet   Oral   Take 650 mg by mouth every  4 (four) hours as needed. For pain         . amLODipine (NORVASC) 5 MG tablet   Oral   Take 5 mg by mouth daily.         Marland Kitchen aspirin 81 MG chewable tablet   Oral   Chew 81 mg by mouth daily.         . Calcium Carbonate-Vitamin D (OS-CAL 500 + D PO)   Oral   Take 1 tablet by mouth 2 (two) times daily.         . Cholecalciferol (VITAMIN D-3 PO)   Oral   Take 1,000 Units by mouth every morning.          . Cranberry 475 MG CAPS   Oral   Take 475 mg by mouth 2 (two) times daily.         . cycloSPORINE (RESTASIS) 0.05 % ophthalmic emulsion   Both Eyes   Place 1 drop into both eyes 2 (two) times daily.         Marland Kitchen dextromethorphan-guaiFENesin (MUCINEX DM)  30-600 MG per 12 hr tablet   Oral   Take 1 tablet by mouth 2 (two) times daily as needed (Cough).         . docusate sodium (COLACE) 100 MG capsule   Oral   Take 100 mg by mouth 2 (two) times daily.          Marland Kitchen escitalopram (LEXAPRO) 10 MG tablet   Oral   Take 10 mg by mouth daily.         Marland Kitchen escitalopram (LEXAPRO) 10 MG tablet   Oral   Take 10 mg by mouth daily.         . furosemide (LASIX) 40 MG tablet   Oral   Take 40-80 mg by mouth 2 (two) times daily. Take 80 mg (two tablets) in the morning.  Take 40 mg (one tablet) in the evening.         . gabapentin (NEURONTIN) 300 MG capsule   Oral   Take 300 mg by mouth at bedtime.          Marland Kitchen HYDROcodone-acetaminophen (NORCO) 10-325 MG per tablet   Oral   Take 1 tablet by mouth every morning.         Marland Kitchen ipratropium (ATROVENT HFA) 17 MCG/ACT inhaler   Inhalation   Inhale 2 puffs into the lungs 4 (four) times daily as needed. For dyspnea         . isosorbide mononitrate (IMDUR) 30 MG 24 hr tablet   Oral   Take 30 mg by mouth daily.         Marland Kitchen LACTOBACILLUS PO   Oral   Take 1 capsule by mouth daily.         Marland Kitchen levalbuterol (XOPENEX) 1.25 MG/3ML nebulizer solution   Nebulization   Take 1.25 mg by nebulization every 4 (four) hours as needed. For shortness of breath         . levothyroxine (SYNTHROID, LEVOTHROID) 88 MCG tablet   Oral   Take 88 mcg by mouth daily.         . Liniments (ORTHOGEL) GEL   Apply externally   Apply 1 application topically 3 (three) times daily as needed (Left shoulder pain).          . LORazepam (ATIVAN) 0.5 MG tablet   Oral   Take 0.5 mg by mouth every 6 (six) hours as needed. For anxiety         .  losartan (COZAAR) 100 MG tablet   Oral   Take 100 mg by mouth daily.         . magnesium hydroxide (MILK OF MAGNESIA) 400 MG/5ML suspension   Oral   Take 30 mLs by mouth daily as needed for constipation.         . OXYGEN-HELIUM IN   Inhalation   Inhale 2 L into the  lungs as needed.         . pantoprazole (PROTONIX) 40 MG tablet   Oral   Take 40 mg by mouth daily.          . polyethylene glycol (MIRALAX / GLYCOLAX) packet   Oral   Take 17 g by mouth every other day.          . potassium chloride SA (K-DUR,KLOR-CON) 20 MEQ tablet   Oral   Take 20 mEq by mouth every morning.          . promethazine (PHENERGAN) 25 MG tablet   Oral   Take 25 mg by mouth every 6 (six) hours as needed for nausea.         Marland Kitchen senna (SENOKOT) 8.6 MG TABS   Oral   Take 1 tablet by mouth every morning.          . simethicone (MYLICON) 125 MG chewable tablet   Oral   Chew 125 mg by mouth 2 (two) times daily as needed for flatulence.          . sodium chloride (OCEAN) 0.65 % SOLN nasal spray   Nasal   Place 1 spray into the nose 4 (four) times daily.         . Travoprost, BAK Free, (TRAVATAN) 0.004 % SOLN ophthalmic solution   Both Eyes   Place 1 drop into both eyes at bedtime.         . triamcinolone (NASACORT) 55 MCG/ACT nasal inhaler   Nasal   Place 2 sprays into the nose daily as needed (Allergies).          . vitamin C (ASCORBIC ACID) 500 MG tablet   Oral   Take 500 mg by mouth 2 (two) times daily.         Marland Kitchen levofloxacin (LEVAQUIN) 250 MG tablet   Oral   Take 250 mg by mouth daily.         . miconazole (MONISTAT 7) 2 % vaginal cream   Vaginal   Place 1 applicator vaginally at bedtime.          BP 222/110  Pulse 94  Resp 19  SpO2 98% Physical Exam  Constitutional: She is oriented to person, place, and time. She appears well-developed and well-nourished.  HENT:  Head: Normocephalic and atraumatic.  Patient has a large blood clot in the right nares. She also has bleeding down the posterior oropharynx.  Eyes: Pupils are equal, round, and reactive to light.  Neck: Normal range of motion. Neck supple.  Cardiovascular: Normal rate, regular rhythm and normal heart sounds.   Pulmonary/Chest: Effort normal and breath sounds  normal. No respiratory distress. She has no wheezes. She has no rales. She exhibits no tenderness.  Abdominal: Soft. Bowel sounds are normal. There is no tenderness. There is no rebound and no guarding.  Musculoskeletal: Normal range of motion. She exhibits no edema.  Lymphadenopathy:    She has no cervical adenopathy.  Neurological: She is alert and oriented to person, place, and time.  Skin: Skin is warm and dry.  No rash noted.  Psychiatric: She has a normal mood and affect.    ED Course  EPISTAXIS MANAGEMENT Date/Time: 06/07/2013 1:00 PM Performed by: Lilleigh Hechavarria Authorized by: Rolan Bucco Consent: Verbal consent obtained. Consent given by: patient Local anesthetic: lidocaine spray Patient sedated: no Treatment site: right anterior Repair method: anterior pack Post-procedure assessment: bleeding stopped Treatment complexity: simple Patient tolerance: Patient tolerated the procedure well with no immediate complications.   (including critical care time) Labs Reviewed - No data to display No results found. 1. Epistaxis     MDM  Patient had an anterior nasal pack in place. She's had no further bleeding since then. Her blood pressures remained elevated but she did not take her blood pressure medicine this morning. She's currently asymptomatic from her elevated blood pressure. She was given a dose of her amlodipine here in the ED. She does have pain around her right sinus and the right side of her head which is likely from nasal packing. She was given a dose of Percocet here in the ED. I advised her to followup in 2 days with her primary care physician or her ENT to have the packing removed or return here for any recurrence of bleeding.  Rolan Bucco, MD 06/07/13 1415

## 2013-06-07 NOTE — ED Notes (Signed)
EMS attempted Afrin PTA without success

## 2013-06-07 NOTE — ED Notes (Signed)
To ED via [facility] from [facility] with nosebleed starting prior to arrival in ED. Pt is alert. Hx HTN. B/P elevated per EMS. Bleeding controlled with pressure placed to bridge of nose only. Pt on ASA

## 2013-07-02 ENCOUNTER — Telehealth: Payer: Self-pay | Admitting: Internal Medicine

## 2013-07-02 NOTE — Telephone Encounter (Signed)
Pts appt moved to 07/04/13@10am  with Willette Cluster NP. Marquita to notify pt of appt date and time. Pt having nausea, vomiting, and weight loss.

## 2013-07-04 ENCOUNTER — Encounter: Payer: Self-pay | Admitting: Internal Medicine

## 2013-07-04 ENCOUNTER — Ambulatory Visit (INDEPENDENT_AMBULATORY_CARE_PROVIDER_SITE_OTHER): Payer: Medicare Other | Admitting: Nurse Practitioner

## 2013-07-04 ENCOUNTER — Encounter: Payer: Self-pay | Admitting: Nurse Practitioner

## 2013-07-04 VITALS — BP 124/68 | HR 80 | Ht 64.0 in | Wt 120.6 lb

## 2013-07-04 DIAGNOSIS — R109 Unspecified abdominal pain: Secondary | ICD-10-CM | POA: Insufficient documentation

## 2013-07-04 DIAGNOSIS — R112 Nausea with vomiting, unspecified: Secondary | ICD-10-CM

## 2013-07-04 DIAGNOSIS — R1319 Other dysphagia: Secondary | ICD-10-CM | POA: Insufficient documentation

## 2013-07-04 DIAGNOSIS — R634 Abnormal weight loss: Secondary | ICD-10-CM | POA: Insufficient documentation

## 2013-07-04 NOTE — Progress Notes (Signed)
HPI :  Patient is an 79 year old female with multiple medical problems known remotely to Dr. Marina Goodell. She has a history of adenomatous colon polyps. Her last surveillance colonoscopy was July 2006 with findings of diverticulosis and a cecal polyp (tubular adenoma)  Patient is referred from PCP for evaluation of swallowing problems. She lives a [facility] where speech therapy apparently evaluated her at some point and found no swallowing issues. Patient reports a several month history of dysphasia, mainly to solid foods. She has a long-standing history of heartburn and is symptomatic despite daily PPI .  Patient describes chronic, intermittent nausea and vomiting which is occuring more frequently (2-3 times a week). Per nursing home records patient has lost about 7 pounds in the last couple of months. She describes diffuse abdominal pain unrelated to meals or defecation. Recent KUB and ultrasound non-diagnostic.   In addition to above patient reports chronic, intermittent constipation for which she takes milk of magnesia as needed. Patient doesn't think she takes anything on a daily basis for her bowels though MiraLax, Senokot and Colace are all listed as scheduled medications. At present her bowels are moving well   Past Medical History  Diagnosis Date  . Falls   . GERD (gastroesophageal reflux disease)   . Rib fractures     left 7th and 9th  . Depression   . Compression fracture     T11 and L1  . CAD (coronary artery disease)     a. s/p PCI to LAD, CFX, RCA in past;  b. Last LHC 11/01: proximal LAD 30-40%, distal LAD 40%, CFX 30-40%, proximal RCA 30%, mid RCA 80-90%, EF 55%.  PCI: Stent to the RCA.;  c. Myoview 2/04: No ischemia or scar, EF 64%.    . Carotid stenosis   . History of stroke   . HTN (hypertension)   . CHF (congestive heart failure)   . Hx of echocardiogram     a. Echo 11/05: EF 55-65%, borderline LVH, mild focal basal septal hypertrophy, mild AI, mild LAE,  mild RAE;   b.  Echo 3/14:  EF 60-65%, normal wall motion, mild AI, MAC, trivial MR, mod BAE, mild to mod TR, PASP 50-54 (mod Pul HTN), small post eff   . PAD (peripheral artery disease)   . COPD (chronic obstructive pulmonary disease)   . Atrial fibrillation   . CKD (chronic kidney disease) stage 3, GFR 30-59 ml/min   . DNR (do not resuscitate)     Family History  Problem Relation Age of Onset  . Prostate cancer Father   . Heart disease Mother    History  Substance Use Topics  . Smoking status: Never Smoker   . Smokeless tobacco: Never Used  . Alcohol Use: No   Current Outpatient Prescriptions  Medication Sig Dispense Refill  . acetaminophen (TYLENOL) 325 MG tablet Take 650 mg by mouth every 4 (four) hours as needed. For pain      . Calcium Carbonate-Vitamin D (OS-CAL 500 + D PO) Take 1 tablet by mouth 2 (two) times daily.      . Cholecalciferol (VITAMIN D-3 PO) Take 1,000 Units by mouth every morning.       . Cranberry 475 MG CAPS Take 475 mg by mouth 2 (two) times daily.      . cycloSPORINE (RESTASIS) 0.05 % ophthalmic emulsion Place 1 drop into both eyes 2 (two) times daily.      Marland Kitchen dextromethorphan-guaiFENesin (MUCINEX DM) 30-600 MG per 12 hr tablet  Take 1 tablet by mouth 2 (two) times daily as needed (Cough).      . docusate sodium (COLACE) 100 MG capsule Take 100 mg by mouth 2 (two) times daily.       Marland Kitchen escitalopram (LEXAPRO) 10 MG tablet Take 10 mg by mouth daily.      . furosemide (LASIX) 40 MG tablet Take 40-80 mg by mouth 2 (two) times daily. Take 80 mg (two tablets) in the morning.  Take 40 mg (one tablet) in the evening.      Marland Kitchen HYDROcodone-acetaminophen (NORCO) 10-325 MG per tablet Take 1 tablet by mouth every morning.      Marland Kitchen ipratropium (ATROVENT HFA) 17 MCG/ACT inhaler Inhale 2 puffs into the lungs 4 (four) times daily as needed. For dyspnea      . isosorbide mononitrate (IMDUR) 30 MG 24 hr tablet Take 30 mg by mouth daily.      Marland Kitchen LACTOBACILLUS PO Take 1 capsule by  mouth daily.      Marland Kitchen levalbuterol (XOPENEX) 1.25 MG/3ML nebulizer solution Take 1.25 mg by nebulization every 4 (four) hours as needed. For shortness of breath      . levothyroxine (SYNTHROID, LEVOTHROID) 88 MCG tablet Take 88 mcg by mouth daily.      . Liniments (ORTHOGEL) GEL Apply 1 application topically 3 (three) times daily as needed (Left shoulder pain).       . LORazepam (ATIVAN) 0.5 MG tablet Take 0.5 mg by mouth every 6 (six) hours as needed. For anxiety      . losartan (COZAAR) 100 MG tablet Take 100 mg by mouth daily.      . magnesium hydroxide (MILK OF MAGNESIA) 400 MG/5ML suspension Take 30 mLs by mouth daily as needed for constipation.      . miconazole (MONISTAT 7) 2 % vaginal cream Place 1 applicator vaginally at bedtime.      . OXYGEN-HELIUM IN Inhale 2 L into the lungs as needed.      . pantoprazole (PROTONIX) 40 MG tablet Take 40 mg by mouth daily.       . polyethylene glycol (MIRALAX / GLYCOLAX) packet Take 17 g by mouth every other day.       . potassium chloride SA (K-DUR,KLOR-CON) 20 MEQ tablet Take 20 mEq by mouth every morning.       . promethazine (PHENERGAN) 25 MG tablet Take 25 mg by mouth every 6 (six) hours as needed for nausea.      Marland Kitchen senna (SENOKOT) 8.6 MG TABS Take 1 tablet by mouth every morning.       . simethicone (MYLICON) 125 MG chewable tablet Chew 125 mg by mouth 2 (two) times daily as needed for flatulence.       . Travoprost, BAK Free, (TRAVATAN) 0.004 % SOLN ophthalmic solution Place 1 drop into both eyes at bedtime.      Marland Kitchen amLODipine (NORVASC) 5 MG tablet Take 5 mg by mouth daily.      Marland Kitchen aspirin 81 MG chewable tablet Chew 81 mg by mouth daily.      . sodium chloride (OCEAN) 0.65 % SOLN nasal spray Place 1 spray into the nose 4 (four) times daily.      Marland Kitchen triamcinolone (NASACORT) 55 MCG/ACT nasal inhaler Place 2 sprays into the nose daily as needed (Allergies).       . vitamin C (ASCORBIC ACID) 500 MG tablet Take 500 mg by mouth 2 (two) times daily.        No current  facility-administered medications for this visit.   Allergies  Allergen Reactions  . Amphotericin B     No reaction stated on MAR  . Bystolic (Nebivolol Hcl)     No reaction stated on MAR  . Codeine     No reaction stated on MAR  . Metoprolol Tartrate     No reaction stated on MAR  . Penicillins     No reaction stated on MAR    Review of Systems: Positive for sinus trouble, anxiety, arthritis, back pain, cough, headaches, heart rhythm changes, nosebleeds, swelling of her legs, excessive urination, and urine leakage. All other systems reviewed and negative except where noted in HPI.   Physical Exam: BP 124/68  Pulse 80  Ht 5\' 4"  (1.626 m)  Wt 120 lb 9.6 oz (54.704 kg)  BMI 20.69 kg/m2 Constitutional: Pleasant,well-developed, white female in no acute distress. HEENT: Normocephalic and atraumatic. Conjunctivae are normal. No scleral icterus. Neck supple.  Cardiovascular: Normal rate, regular rhythm.  Pulmonary/chest: Effort normal and breath sounds normal. No wheezing, rales or rhonchi. Abdominal: Soft, nondistended, mild epigastric tenderness. owel sounds active throughout. There are no masses palpable. No hepatomegaly. Extremities: no edema Lymphadenopathy: No cervical adenopathy noted. Neurological: Alert and oriented to person place and time. Skin: Skin is warm and dry. No rashes noted. Psychiatric: Normal mood and affect. Behavior is normal.   ASSESSMENT AND PLAN:  1. Solid food dysphasia. This could be esophageal dysmotility but stricture should be excluded. Given advanced age a barium swallow is one option but patient looks amazingly well so we will proceed with EGD with possible dilation. Patient has COPD and uses oxygen at night requiring procedure to be done at [facility]. The, risks, and potential complications of EGD with possible biopsies and/or dilation were discussed with the patient and she agrees to proceed.   2. chronic constipation,  stable per patient.  3. diffuse abdominal pain  4. chronic, intermittent and nausea and vomiting, progressive. Further evaluaton at time of EGD.

## 2013-07-04 NOTE — Patient Instructions (Addendum)
We have scheduled the Endoscopy with Dr. Yancey Flemings at [facility], 1st floor. Go to patient registration, front door ( with water fountain ) of the hospital. We have given you instructions printed out.  Stay on the Protonix 40 mg , 1 tab , take 30 min before breakfast.

## 2013-07-07 ENCOUNTER — Telehealth: Payer: Self-pay | Admitting: Internal Medicine

## 2013-07-07 NOTE — Telephone Encounter (Signed)
Pts daughter called and wants to know if her mother could have the EGD with Dr. Marina Goodell at the hospital sooner than 08/11/13. States she is having quite a bit of pain in her stomach. Daughter is also going to be out of town on 08/11/13 and if we cannot move it up earlier she has to have it changed to another day that week because she is out of town on the 15th. Daughter aware that Dr. Marina Goodell is out of town, will send note to Dr. Marina Goodell and get his recommendation. Dr. Marina Goodell please advise.

## 2013-07-13 NOTE — Telephone Encounter (Signed)
There was no mention of pain during her evaluation with the extender.  If this is new, then re-evaluation with extender (she may need Barium swallow / UGI). In terms of EGD / Dilation at hospital (because of her age / comorbidities), this is my soonest appointment. You may change days, that same week, if that helps them. Thanks

## 2013-07-14 ENCOUNTER — Ambulatory Visit: Payer: Medicare Other | Admitting: Internal Medicine

## 2013-07-14 NOTE — Telephone Encounter (Signed)
Pts daughter states that her mother has been complaining of abdominal pain. Pt scheduled to see Doug Sou PA 07/16/13. Daughter would like hospital procedure moved to another day that week because she will be out of town Monday. Called to try and move appt but no propofol spots available yet. Will try back later to see if any available.

## 2013-07-14 NOTE — Progress Notes (Signed)
Agree with initial assessment and plans. High-risk patient needs to be done at the hospital

## 2013-07-14 NOTE — Telephone Encounter (Signed)
NP from [facility] called and wanted to make sure that we know the pt complains of nausea every day and has also had some weight loss.

## 2013-07-15 ENCOUNTER — Other Ambulatory Visit: Payer: Self-pay | Admitting: Internal Medicine

## 2013-07-16 ENCOUNTER — Encounter: Payer: Self-pay | Admitting: Gastroenterology

## 2013-07-16 ENCOUNTER — Encounter (HOSPITAL_COMMUNITY): Payer: Self-pay | Admitting: Pharmacy Technician

## 2013-07-16 ENCOUNTER — Ambulatory Visit (INDEPENDENT_AMBULATORY_CARE_PROVIDER_SITE_OTHER): Payer: Medicare Other | Admitting: Gastroenterology

## 2013-07-16 VITALS — Ht 64.0 in | Wt 120.0 lb

## 2013-07-16 DIAGNOSIS — R634 Abnormal weight loss: Secondary | ICD-10-CM

## 2013-07-16 DIAGNOSIS — R1084 Generalized abdominal pain: Secondary | ICD-10-CM

## 2013-07-16 DIAGNOSIS — R112 Nausea with vomiting, unspecified: Secondary | ICD-10-CM | POA: Insufficient documentation

## 2013-07-16 MED ORDER — ONDANSETRON HCL 4 MG PO TABS: ORAL_TABLET | ORAL | Status: DC

## 2013-07-16 MED ORDER — PANTOPRAZOLE SODIUM 40 MG PO TBEC: DELAYED_RELEASE_TABLET | ORAL | Status: DC

## 2013-07-16 MED ORDER — ONDANSETRON HCL 4 MG PO TABS: 4.0000 mg | ORAL_TABLET | Freq: Three times a day (TID) | ORAL | Status: DC | PRN

## 2013-07-16 NOTE — Progress Notes (Signed)
07/16/2013 [name] [id number] 1922/07/27   History of Present Illness:  This is a patient of Dr. Lamar Sprinkles who was just seen by our NP earlier this month for complaints of dysphagia and scheduled for an EGD.  She comes back in today with complaints of abdominal pain with nausea and vomiting.  Says that this has been occurring for the past couple of months.  Pain is constant but does not necessarily prevent her from sleeping at night.  Pain is mostly in the epigastrium but is sore all over.  Nausea is constant as well; gets phenergan prn but says that it does not always work for the nausea.  Sometimes vomits food, but sometimes it is just liquid material.  Still having the dysphagia as described at her last visit.  She is on protonix once daily.  Says that she is moving her bowels ok and uses MOM with success if needed.  Has lost weight as well, possibly 15 pounds.  CT scan of the abdomen and pelvis without contrast in February of this year did not show any concerning issues at that time.   Current Medications, Allergies, Past Medical History, Past Surgical History, Family History and Social History were reviewed in [employer] record.   Physical Exam: Ht 5\' 4"  (1.626 m)  Wt 120 lb (54.432 kg)  BMI 20.59 kg/m2 General:  Elderly white female in no acute distress Head: Normocephalic and atraumatic Eyes:  sclerae anicteric, conjunctiva pink  Ears: Normal auditory acuity Lungs: Clear throughout to auscultation Heart: Regular rate and rhythm Abdomen: Soft, non-distended.  BS present.  Diffuse TTP without R/R/G. Musculoskeletal: Symmetrical with no gross deformities  Extremities: No edema  Neurological: Alert oriented x 4, grossly nonfocal Psychological:  Alert and cooperative. Normal mood and affect  Assessment and Recommendations: -79 year old female with abdominal pain, nausea, vomiting, weight loss of approximately 15 pounds, and dysphagia.  Will proceed with  EGD as originally planned.  Will increase protonix to BID.  Will give zofran 4 mg every 6 hours ATC and can still use phenergan prn.  Check BMP.  Repeat CT scan of the abdomen and pelvis with contrast if renal function allows.

## 2013-07-16 NOTE — Telephone Encounter (Signed)
Pt appt has been moved to 08/12/13@12noon . Pt aware.

## 2013-07-16 NOTE — Progress Notes (Signed)
Agree with initial assessment and plans 

## 2013-07-16 NOTE — Patient Instructions (Addendum)
We have changed the Endoscopy with Dr Yancey Flemings at [facility] End[facility] Unit, 1st floor. The date is now 08-12-2013. She is to arrive at 10:30 am. She will have no food after midnight.  She she can have clear liquids up to 8:00 am.   We sent prescriptions to [facility], [place] for Protonix 40 mg, 1 tab by mouth twice daily. Also Zofran ( Ondansetron ) 4 mg. Take 1 tab every 6 hours around the clock for nausea .   You have been scheduled for a CT scan of the abdomen and pelvis at [facility] [address]  You are scheduled on Friday 07-18-2013 at 10:45 Am . You should arrive at 10:30 am  prior to your appointment time for registration. Please follow the written instructions below on the day of your exam:    1) Do not eat solid food after 7:45 am. (4 hours prior to your test) 2) You have been given 2 bottles of oral contrast to drink. The solution may taste better if refrigerated, but do NOT add ice or any other liquid to this solution. Shake well before drinking.    Drink 1 bottle of contrast @ 9:00 am  (2 hours prior to your exam)  Drink 1 bottle of contrast @ 10:00 am  (1 hour prior to your exam)  You may take any medications as prescribed with a small amount of water except for the following: Metformin, Glucophage, Glucovance, Avandamet, Riomet, Fortamet, Actoplus Met, Janumet, Glumetza or Metaglip. The above medications must be held the day of the exam AND 48 hours after the exam.  The purpose of you drinking the oral contrast is to aid in the visualization of your intestinal tract. The contrast solution may cause some diarrhea. Before your exam is started, you will be given a small amount of fluid to drink. Depending on your individual set of symptoms, you may also receive an intravenous injection of x-ray contrast/dye. Plan on being at [facility] for 30 minutes or long, depending on the type of exam you are having performed.  If you  have any questions regarding your exam or if you need to reschedule, you may call the CT department at [phone] between the hours of 8:00 am and 5:00 pm, Monday-Friday.  ________________________________________________________________________

## 2013-07-18 ENCOUNTER — Other Ambulatory Visit: Payer: Medicare Other

## 2013-07-18 ENCOUNTER — Ambulatory Visit
Admission: RE | Admit: 2013-07-18 | Discharge: 2013-07-18 | Disposition: A | Discharge status: Home / Self Care | Payer: Medicare Other | Source: Ambulatory Visit | Attending: Internal Medicine | Admitting: Internal Medicine

## 2013-07-18 ENCOUNTER — Ambulatory Visit
Admission: RE | Admit: 2013-07-18 | Discharge: 2013-07-18 | Disposition: A | Discharge status: Home / Self Care | Payer: Medicare Other | Source: Ambulatory Visit | Attending: Gastroenterology | Admitting: Gastroenterology

## 2013-07-18 DIAGNOSIS — R1084 Generalized abdominal pain: Secondary | ICD-10-CM

## 2013-07-18 DIAGNOSIS — R519 Headache, unspecified: Secondary | ICD-10-CM

## 2013-07-18 DIAGNOSIS — R634 Abnormal weight loss: Secondary | ICD-10-CM

## 2013-07-18 DIAGNOSIS — R112 Nausea with vomiting, unspecified: Secondary | ICD-10-CM

## 2013-07-18 MED ORDER — IOHEXOL 300 MG/ML  SOLN
100.0000 mL | Freq: Once | INTRAMUSCULAR | Status: AC | PRN
  Administered 2013-07-18: 100 mL via INTRAVENOUS

## 2013-07-22 ENCOUNTER — Encounter (HOSPITAL_COMMUNITY): Payer: Self-pay | Admitting: *Deleted

## 2013-07-22 NOTE — Progress Notes (Signed)
Spoke with janet unit secreatry [facility] faxed all pre op instructions to [phone], fax confirmation received and janet stated receiving all instructions and will give faxed instructions to antionette williams LPN PATIENTS'S NURSE. CALLED AND SPOKE WITH PATIENTS HEALTHCARE POA DAUGHTER MARSHA COLE AT PHONE NUMBER [phone]. MARSHA COLE FAXED A COPY OF ALL PRE OP INSTRUCTIONS TO [id number]. MARSHA COLE STATED WILL BRING PATIENT FOR PROCEDURE AND PROVIDE TRANSPORTATION. CALLED AND MADE JANET UNIT SECRETARY AWARE PT DAUGHTER MARSHA COLE TO PROVIDE TRANSPORTATION FOR PROCEDURE 08-12-2013 .

## 2013-08-12 ENCOUNTER — Encounter (HOSPITAL_COMMUNITY): Payer: Self-pay | Admitting: *Deleted

## 2013-08-12 ENCOUNTER — Encounter (HOSPITAL_COMMUNITY): Payer: Self-pay | Admitting: Internal Medicine

## 2013-08-12 ENCOUNTER — Encounter (HOSPITAL_COMMUNITY)
Admission: RE | Disposition: A | Discharge status: Home / Self Care | Payer: Self-pay | Source: Ambulatory Visit | Attending: Internal Medicine

## 2013-08-12 ENCOUNTER — Ambulatory Visit (HOSPITAL_COMMUNITY)
Admission: RE | Admit: 2013-08-12 | Discharge: 2013-08-12 | Disposition: A | Discharge status: Home / Self Care | Payer: PRIVATE HEALTH INSURANCE | Source: Ambulatory Visit | Attending: Internal Medicine | Admitting: Internal Medicine

## 2013-08-12 ENCOUNTER — Ambulatory Visit (HOSPITAL_COMMUNITY): Payer: PRIVATE HEALTH INSURANCE | Admitting: *Deleted

## 2013-08-12 DIAGNOSIS — K449 Diaphragmatic hernia without obstruction or gangrene: Secondary | ICD-10-CM | POA: Insufficient documentation

## 2013-08-12 DIAGNOSIS — R109 Unspecified abdominal pain: Secondary | ICD-10-CM

## 2013-08-12 DIAGNOSIS — I10 Essential (primary) hypertension: Secondary | ICD-10-CM | POA: Insufficient documentation

## 2013-08-12 DIAGNOSIS — Z8673 Personal history of transient ischemic attack (TIA), and cerebral infarction without residual deficits: Secondary | ICD-10-CM | POA: Insufficient documentation

## 2013-08-12 DIAGNOSIS — J449 Chronic obstructive pulmonary disease, unspecified: Secondary | ICD-10-CM | POA: Insufficient documentation

## 2013-08-12 DIAGNOSIS — R634 Abnormal weight loss: Secondary | ICD-10-CM

## 2013-08-12 DIAGNOSIS — K222 Esophageal obstruction: Secondary | ICD-10-CM

## 2013-08-12 DIAGNOSIS — R1319 Other dysphagia: Secondary | ICD-10-CM

## 2013-08-12 DIAGNOSIS — I2510 Atherosclerotic heart disease of native coronary artery without angina pectoris: Secondary | ICD-10-CM | POA: Insufficient documentation

## 2013-08-12 DIAGNOSIS — J4489 Other specified chronic obstructive pulmonary disease: Secondary | ICD-10-CM | POA: Insufficient documentation

## 2013-08-12 DIAGNOSIS — R112 Nausea with vomiting, unspecified: Secondary | ICD-10-CM

## 2013-08-12 DIAGNOSIS — K2940 Chronic atrophic gastritis without bleeding: Secondary | ICD-10-CM | POA: Insufficient documentation

## 2013-08-12 HISTORY — DX: Radiculopathy, lumbosacral region: M54.17

## 2013-08-12 HISTORY — DX: Hypokalemia: E87.6

## 2013-08-12 HISTORY — DX: Spinal stenosis, site unspecified: M48.00

## 2013-08-12 HISTORY — DX: Patellar tendinitis, unspecified knee: M76.50

## 2013-08-12 HISTORY — DX: Adult failure to thrive: R62.7

## 2013-08-12 HISTORY — DX: Sciatica, unspecified side: M54.30

## 2013-08-12 HISTORY — DX: Esophageal obstruction: K22.2

## 2013-08-12 HISTORY — DX: Muscle weakness (generalized): M62.81

## 2013-08-12 HISTORY — DX: Difficulty in walking, not elsewhere classified: R26.2

## 2013-08-12 HISTORY — DX: Dorsalgia, unspecified: M54.9

## 2013-08-12 HISTORY — DX: Cerebral infarction, unspecified: I63.9

## 2013-08-12 HISTORY — DX: Reserved for concepts with insufficient information to code with codable children: IMO0002

## 2013-08-12 HISTORY — PX: ESOPHAGOGASTRODUODENOSCOPY: SHX5428

## 2013-08-12 HISTORY — DX: Unspecified lack of coordination: R27.9

## 2013-08-12 HISTORY — DX: Age-related osteoporosis without current pathological fracture: M81.0

## 2013-08-12 HISTORY — DX: Unspecified abnormalities of gait and mobility: R26.9

## 2013-08-12 HISTORY — DX: Pathological fracture, other site, initial encounter for fracture: M84.48XA

## 2013-08-12 SURGERY — EGD (ESOPHAGOGASTRODUODENOSCOPY)
Anesthesia: Monitor Anesthesia Care

## 2013-08-12 MED ORDER — FENTANYL CITRATE 0.05 MG/ML IJ SOLN
INTRAMUSCULAR | Status: DC | PRN
  Administered 2013-08-12 (×2): 25 ug via INTRAVENOUS

## 2013-08-12 MED ORDER — BUTAMBEN-TETRACAINE-BENZOCAINE 2-2-14 % EX AERO
INHALATION_SPRAY | CUTANEOUS | Status: DC | PRN
  Administered 2013-08-12: 2 via TOPICAL

## 2013-08-12 MED ORDER — MIDAZOLAM HCL 10 MG/2ML IJ SOLN
INTRAMUSCULAR | Status: AC
  Filled 2013-08-12: qty 4

## 2013-08-12 MED ORDER — FENTANYL CITRATE 0.05 MG/ML IJ SOLN
INTRAMUSCULAR | Status: AC
  Filled 2013-08-12: qty 4

## 2013-08-12 MED ORDER — PROPOFOL INFUSION 10 MG/ML OPTIME
INTRAVENOUS | Status: DC | PRN
  Administered 2013-08-12: 100 ug/kg/min via INTRAVENOUS

## 2013-08-12 MED ORDER — SODIUM CHLORIDE 0.9 % IV SOLN
INTRAVENOUS | Status: DC
  Administered 2013-08-12: 12:00:00 via INTRAVENOUS

## 2013-08-12 MED ORDER — DIPHENHYDRAMINE HCL 50 MG/ML IJ SOLN
INTRAMUSCULAR | Status: AC
  Filled 2013-08-12: qty 1

## 2013-08-12 NOTE — Anesthesia Preprocedure Evaluation (Signed)
Anesthesia Evaluation  Patient identified by MRN, date of birth, ID band Patient awake    Reviewed: Allergy & Precautions, H&P , NPO status , Patient's Chart, lab work & pertinent test results  Airway Mallampati: II TM Distance: >3 FB Neck ROM: Full    Dental no notable dental hx.    Pulmonary COPDformer smoker,  breath sounds clear to auscultation  Pulmonary exam normal       Cardiovascular hypertension, Pt. on medications + CAD, + Cardiac Stents and +CHF + dysrhythmias Atrial Fibrillation Rhythm:Regular Rate:Normal     Neuro/Psych CVA, No Residual Symptoms negative psych ROS   GI/Hepatic negative GI ROS, Neg liver ROS,   Endo/Other  negative endocrine ROS  Renal/GU negative Renal ROS  negative genitourinary   Musculoskeletal negative musculoskeletal ROS (+)   Abdominal   Peds negative pediatric ROS (+)  Hematology negative hematology ROS (+)   Anesthesia Other Findings   Reproductive/Obstetrics negative OB ROS                           Anesthesia Physical Anesthesia Plan  ASA: III  Anesthesia Plan: MAC   Post-op Pain Management:    Induction:   Airway Management Planned: Nasal Cannula and Simple Face Mask  Additional Equipment:   Intra-op Plan:   Post-operative Plan:   Informed Consent: I have reviewed the patients History and Physical, chart, labs and discussed the procedure including the risks, benefits and alternatives for the proposed anesthesia with the patient or authorized representative who has indicated his/her understanding and acceptance.   Dental advisory given  Plan Discussed with: CRNA  Anesthesia Plan Comments:         Anesthesia Quick Evaluation

## 2013-08-12 NOTE — H&P (View-Only) (Signed)
Agree with initial assessment and plans 

## 2013-08-12 NOTE — Anesthesia Postprocedure Evaluation (Signed)
  Anesthesia Post-op Note  Patient: [name]  Procedure(s) Performed: Procedure(s) (LRB): ESOPHAGOGASTRODUODENOSCOPY (EGD) (N/A)  Patient Location: PACU  Anesthesia Type: MAC  Level of Consciousness: awake and alert   Airway and Oxygen Therapy: Patient Spontanous Breathing  Post-op Pain: mild  Post-op Assessment: Post-op Vital signs reviewed, Patient's Cardiovascular Status Stable, Respiratory Function Stable, Patent Airway and No signs of Nausea or vomiting  Last Vitals:  Filed Vitals:   08/12/13 1315  BP: 155/79  Temp:   Resp: 20    Post-op Vital Signs: stable   Complications: No apparent anesthesia complications

## 2013-08-12 NOTE — Transfer of Care (Signed)
Immediate Anesthesia Transfer of Care Note  Patient: [name]  Procedure(s) Performed: Procedure(s): ESOPHAGOGASTRODUODENOSCOPY (EGD) (N/A)  Patient Location: PACU and Endoscopy Unit  Anesthesia Type:mac  Level of Consciousness: awake, alert , oriented and patient cooperative  Airway & Oxygen Therapy: Patient Spontanous Breathing and Patient connected to face mask oxygen  Post-op Assessment: Report given to PACU RN, Post -op Vital signs reviewed and stable and Patient moving all extremities  Post vital signs: Reviewed and stable  Complications: No apparent anesthesia complications

## 2013-08-12 NOTE — Op Note (Signed)
[facility] [address] [place], [address]   ENDOSCOPY PROCEDURE REPORT  PATIENT: [name], [name]  MR#: [id number] BIRTHDATE: Apr 17, 1922 , 89  yrs. old GENDER: Female ENDOSCOPIST: Roxy Cedar, MD REFERRED BY:  Geoffry Paradise, M.D. PROCEDURE DATE:  08/12/2013 PROCEDURE:  EGD, diagnostic and Maloney dilation of esophagus  -87 F ASA CLASS:     Class III INDICATIONS:  Dysphagia.   Therapeutic procedure.   Nausea. Vomiting.   abdominal pain. MEDICATIONS: MAC sedation, administered by CRNA and See Anesthesia Report. TOPICAL ANESTHETIC: Cetacaine Spray  DESCRIPTION OF PROCEDURE: After the risks benefits and alternatives of the procedure were thoroughly explained, informed consent was obtained.  The PENTAX GASTOROSCOPE [id number] endoscope was introduced through the mouth and advanced to the second portion of the duodenum. Without limitations.  The instrument was slowly withdrawn as the mucosa was fully examined.    EXAM:The esophagus was remarkable for multiple benign rings in the distal portion.  The stomach revealed atrophic gastritis but was otherwise normal.  The duodenum was normal.   The esophagus was remarkable for multiple benign rings in the distal portion.  The stomach revealed atrophic gastritis but was otherwise normal.  The duodenum was normal.  Retroflexed views revealed a hiatal hernia. The scope was then withdrawn from the patient and the procedure completed. THERAPY: 54 French Maloney dilator was passed into the esophagus. No resistance. Trace heme. Tolerated well.  COMPLICATIONS: There were no complications. ENDOSCOPIC IMPRESSION: 1. Benign stricturing of the esophagus status post dilation-54 [place]  RECOMMENDATIONS: 1.  Clear liquids until 3 PM, then soft foods rest iof day.  Resume prior diet tomorrow. 2.  Continue current GI medicationsstatewithout change  REPEAT EXAM:  eSigned:  Roxy Cedar, MD 08/12/2013 12:47  PM   XB:[id number] Jacky Kindle, MD and The Patient

## 2013-08-12 NOTE — Interval H&P Note (Signed)
History and Physical Interval Note:  08/12/2013 11:04 AM  [name]  has presented today for surgery, with the diagnosis of Dysphagia [787.20]  The various methods of treatment have been discussed with the patient and family. After consideration of risks, benefits and other options for treatment, the patient has consented to  Procedure(s): ESOPHAGOGASTRODUODENOSCOPY (EGD) (N/A) as a surgical intervention .  The patient's history has been reviewed, patient examined, no change in status, stable for surgery.  I have reviewed the patient's chart and labs.  Questions were answered to the patient's satisfaction.     Yancey Flemings

## 2013-08-13 ENCOUNTER — Encounter (HOSPITAL_COMMUNITY): Payer: Self-pay | Admitting: Internal Medicine

## 2013-09-03 ENCOUNTER — Ambulatory Visit (INDEPENDENT_AMBULATORY_CARE_PROVIDER_SITE_OTHER): Payer: PRIVATE HEALTH INSURANCE | Admitting: Cardiology

## 2013-09-03 ENCOUNTER — Ambulatory Visit: Payer: PRIVATE HEALTH INSURANCE | Admitting: Physician Assistant

## 2013-09-03 ENCOUNTER — Encounter: Payer: Self-pay | Admitting: Cardiology

## 2013-09-03 VITALS — BP 134/66 | HR 87 | Ht 64.0 in | Wt 120.8 lb

## 2013-09-03 DIAGNOSIS — I5032 Chronic diastolic (congestive) heart failure: Secondary | ICD-10-CM

## 2013-09-03 DIAGNOSIS — I10 Essential (primary) hypertension: Secondary | ICD-10-CM

## 2013-09-03 DIAGNOSIS — I679 Cerebrovascular disease, unspecified: Secondary | ICD-10-CM

## 2013-09-03 DIAGNOSIS — I2510 Atherosclerotic heart disease of native coronary artery without angina pectoris: Secondary | ICD-10-CM

## 2013-09-03 DIAGNOSIS — I4891 Unspecified atrial fibrillation: Secondary | ICD-10-CM

## 2013-09-03 LAB — BASIC METABOLIC PANEL
BUN: 45 mg/dL — ABNORMAL HIGH (ref 6–23)
Chloride: 93 mEq/L — ABNORMAL LOW (ref 96–112)
GFR: 30.68 mL/min — ABNORMAL LOW (ref >60.00)
Glucose, Bld: 93 mg/dL (ref 70–99)
Potassium: 4.8 mEq/L (ref 3.5–5.1)
Sodium: 138 mEq/L (ref 135–145)

## 2013-09-03 NOTE — Assessment & Plan Note (Signed)
Continue aspirin 

## 2013-09-03 NOTE — Patient Instructions (Signed)
Your physician wants you to follow-up in: 6 MONTHS WITH DR CRENSHAW You will receive a reminder letter in the mail two months in advance. If you don't receive a letter, please call our office to schedule the follow-up appointment.   Your physician recommends that you HAVE LAB WORK TODAY 

## 2013-09-03 NOTE — Progress Notes (Signed)
HPI: FU diastolic CHF and CAD. Previously followed by Dr. Daleen Squibb. She has had prior interventions to the LAD, LCX and RCA; also with carotid stenosis, PAD, HTN, RAS, COPD. LHC 11/01: proximal LAD 30-40%, distal LAD 40%, CFX 30-40%, proximal RCA 30%, mid RCA 80-90%, EF 55%. PCI: Stent to the RCA. Myoview 2/04: No ischemia or scar, EF 64%. Her other issues include CHF, CKD, chronic atrial fib and a poor candidate for coumadin. Multiple falls. Last echo in March of 2014 with normal EF, moderate pulmonary HTN, mild AI, MAD, trivial MR and moderate BAE, with mild to moderate TR. She is a DNR. Resides at [facility]. In June of 2014. Since that time, she does have dyspnea which sounds to be chronic. There is no orthopnea or PND. Her pedal edema is controlled with diuretics. She denies chest pain or palpitations. Multiple falls in the past.   Current Outpatient Prescriptions  Medication Sig Dispense Refill  . acetaminophen (TYLENOL) 325 MG tablet Take 650 mg by mouth every 4 (four) hours as needed. For pain      . amLODipine (NORVASC) 5 MG tablet Take 5 mg by mouth daily.      Marland Kitchen aspirin 81 MG chewable tablet Chew 81 mg by mouth daily.      . Calcium Carbonate-Vitamin D (OS-CAL 500 + D PO) Take 1 tablet by mouth 2 (two) times daily.      . Cholecalciferol (VITAMIN D-3 PO) Take 1,000 Units by mouth every morning.       . Cranberry 475 MG CAPS Take 475 mg by mouth 2 (two) times daily.      . cycloSPORINE (RESTASIS) 0.05 % ophthalmic emulsion Place 1 drop into both eyes 2 (two) times daily.      Marland Kitchen dextromethorphan-guaiFENesin (MUCINEX DM) 30-600 MG per 12 hr tablet Take 1 tablet by mouth 2 (two) times daily as needed (Cough).      . docusate sodium (COLACE) 100 MG capsule Take 100 mg by mouth 2 (two) times daily.       Marland Kitchen escitalopram (LEXAPRO) 10 MG tablet Take 10 mg by mouth every morning.       . feeding supplement (PRO-STAT SUGAR FREE 64) LIQD Take 30 mLs by mouth daily.      . furosemide (LASIX) 40  MG tablet Take 40-80 mg by mouth 2 (two) times daily. Take 80 mg (two tablets) in the morning.  Take 40 mg (one tablet) in the evening.      . gabapentin (NEURONTIN) 300 MG capsule Take 300 mg by mouth at bedtime.      Marland Kitchen HYDROcodone-acetaminophen (NORCO) 10-325 MG per tablet Take 1 tablet by mouth every morning.      Marland Kitchen ipratropium (ATROVENT HFA) 17 MCG/ACT inhaler Inhale 2 puffs into the lungs 4 (four) times daily as needed. For dyspnea      . isosorbide mononitrate (IMDUR) 30 MG 24 hr tablet Take 30 mg by mouth every morning.       . lactobacillus acidophilus & bulgar (LACTINEX) chewable tablet Chew 1 tablet by mouth daily.      Marland Kitchen levalbuterol (XOPENEX) 1.25 MG/3ML nebulizer solution Take 1.25 mg by nebulization every 4 (four) hours as needed. For shortness of breath      . levothyroxine (SYNTHROID, LEVOTHROID) 88 MCG tablet Take 88 mcg by mouth daily.      . Liniments (ORTHOGEL) GEL Apply 1 application topically 3 (three) times daily as needed (Left shoulder pain).       Marland Kitchen  LORazepam (ATIVAN) 0.5 MG tablet Take 0.5 mg by mouth every 6 (six) hours as needed. For anxiety      . losartan (COZAAR) 100 MG tablet Take 100 mg by mouth daily.      . magnesium hydroxide (MILK OF MAGNESIA) 400 MG/5ML suspension Take 30 mLs by mouth daily as needed for constipation.      . nitrofurantoin (MACRODANTIN) 100 MG capsule Take 100 mg by mouth daily.      . ondansetron (ZOFRAN) 4 MG tablet Take 1 tab every 6 hours around the clock as needed for nausea.  120 tablet  1  . OXYGEN-HELIUM IN Inhale 2 L into the lungs as needed.      . pantoprazole (PROTONIX) 40 MG tablet Take 1 tab  daily      . polyethylene glycol (MIRALAX / GLYCOLAX) packet Take 17 g by mouth every other day.       . potassium chloride SA (K-DUR,KLOR-CON) 20 MEQ tablet Take 20 mEq by mouth every morning.       . promethazine (PHENERGAN) 25 MG tablet Take 25 mg by mouth every 6 (six) hours as needed for nausea.      Marland Kitchen senna (SENOKOT) 8.6 MG TABS Take  1 tablet by mouth every morning.       . simethicone (MYLICON) 125 MG chewable tablet Chew 125 mg by mouth 2 (two) times daily as needed for flatulence.       . sodium chloride (OCEAN) 0.65 % SOLN nasal spray Place 1 spray into the nose 4 (four) times daily.      . Travoprost, BAK Free, (TRAVATAN) 0.004 % SOLN ophthalmic solution Place 1 drop into both eyes at bedtime.      . triamcinolone (NASACORT) 55 MCG/ACT nasal inhaler Place 2 sprays into the nose daily as needed (Allergies).       . vitamin C (ASCORBIC ACID) 500 MG tablet Take 500 mg by mouth 2 (two) times daily.       No current facility-administered medications for this visit.     Past Medical History  Diagnosis Date  . Falls   . GERD (gastroesophageal reflux disease)   . Rib fractures     left 7th and 9th  . Depression   . Compression fracture     T11 and L1  . CAD (coronary artery disease)     a. s/p PCI to LAD, CFX, RCA in past;  b. Last LHC 11/01: proximal LAD 30-40%, distal LAD 40%, CFX 30-40%, proximal RCA 30%, mid RCA 80-90%, EF 55%.  PCI: Stent to the RCA.;  c. Myoview 2/04: No ischemia or scar, EF 64%.    . Carotid stenosis   . History of stroke   . HTN (hypertension)   . CHF (congestive heart failure)   . Hx of echocardiogram     a. Echo 11/05: EF 55-65%, borderline LVH, mild focal basal septal hypertrophy, mild AI, mild LAE, mild RAE;   b.  Echo 3/14:  EF 60-65%, normal wall motion, mild AI, MAC, trivial MR, mod BAE, mild to mod TR, PASP 50-54 (mod Pul HTN), small post eff   . PAD (peripheral artery disease)   . COPD (chronic obstructive pulmonary disease)   . DNR (do not resuscitate)   . Hypopotassemia   . Atrial fibrillation   . Difficulty walking     uses wheelchair  . Sciatica   . Disc degeneration   . Spinal stenosis   . Lumbosacral neuritis   .  Backache   . Patellar tendinitis   . Muscle weakness (generalized)   . Osteoporosis   . Pathologic fracture of vertebra   . Gait abnormality   . Lack of  coordination   . Failure to thrive in adult   . Stroke   . CKD (chronic kidney disease) stage 3, GFR 30-59 ml/min     Past Surgical History  Procedure Laterality Date  . Back surgery    . Angioplasty      x3  . Appendectomy    . Abdominal hysterectomy    . Esophagogastroduodenoscopy N/A 08/12/2013    Procedure: ESOPHAGOGASTRODUODENOSCOPY (EGD);  Surgeon: Hilarie Fredrickson, MD;  Location: [facility];  Service: Endoscopy;  Laterality: N/A;    History   Social History  . Marital Status: Widowed    Spouse Name: N/A    Number of Children: 1  . Years of Education: N/A   Occupational History  . Retired    Social History Main Topics  . Smoking status: Never Smoker   . Smokeless tobacco: Never Used  . Alcohol Use: No  . Drug Use: No  . Sexual Activity: Not Currently   Other Topics Concern  . Not on file   Social History Narrative  . No narrative on file    ROS: complains of abdominal pain but no fevers or chills, productive cough, hemoptysis, dysphasia, odynophagia, melena, hematochezia, dysuria, hematuria, rash, seizure activity, orthopnea, PND, pedal edema, claudication. Remaining systems are negative.  Physical Exam: Well-developed chronically ill appearing in no acute distress.  Skin is warm and dry.  HEENT is normal.  Neck is supple.  Chest mild basilar crackles Cardiovascular exam is irregular Abdominal exam nontender or distended. No masses palpated. Extremities show no edema. neuro grossly intact  ECG atrial fibrillation at a rate 7. No ST changes.

## 2013-09-03 NOTE — Assessment & Plan Note (Signed)
Patient appears to be euvolemic on examination. Continue present dose of Lasix. Check potassium, renal function and BNP. 

## 2013-09-03 NOTE — Assessment & Plan Note (Signed)
Patient remains in atrial fibrillation. Rate is controlled on no AV nodal blocking agents. Continue aspirin. No Coumadin given history of recurrent falls.

## 2013-09-03 NOTE — Assessment & Plan Note (Signed)
Blood pressure controlled. Continue present medications. 

## 2013-09-05 ENCOUNTER — Other Ambulatory Visit: Payer: Self-pay | Admitting: *Deleted

## 2013-09-05 MED ORDER — FUROSEMIDE 40 MG PO TABS: 40.0000 mg | ORAL_TABLET | Freq: Two times a day (BID) | ORAL | Status: DC

## 2013-09-11 ENCOUNTER — Ambulatory Visit (INDEPENDENT_AMBULATORY_CARE_PROVIDER_SITE_OTHER): Payer: Medicare Other | Admitting: Internal Medicine

## 2013-09-11 ENCOUNTER — Ambulatory Visit (INDEPENDENT_AMBULATORY_CARE_PROVIDER_SITE_OTHER)
Admission: RE | Admit: 2013-09-11 | Discharge: 2013-09-11 | Disposition: A | Discharge status: Home / Self Care | Payer: Medicare Other | Source: Ambulatory Visit | Attending: Internal Medicine | Admitting: Internal Medicine

## 2013-09-11 ENCOUNTER — Encounter: Payer: Self-pay | Admitting: Internal Medicine

## 2013-09-11 VITALS — BP 100/58 | HR 81 | Temp 98.3°F | Ht 64.5 in | Wt 117.0 lb

## 2013-09-11 DIAGNOSIS — J449 Chronic obstructive pulmonary disease, unspecified: Secondary | ICD-10-CM

## 2013-09-11 DIAGNOSIS — J9610 Chronic respiratory failure, unspecified whether with hypoxia or hypercapnia: Secondary | ICD-10-CM

## 2013-09-11 MED ORDER — TIOTROPIUM BROMIDE MONOHYDRATE 18 MCG IN CAPS: 18.0000 ug | ORAL_CAPSULE | Freq: Every day | RESPIRATORY_TRACT | Status: AC

## 2013-09-11 NOTE — Assessment & Plan Note (Addendum)
DDX of  difficult airways managment all start with A and  include Adherence, Ace Inhibitors, Acid Reflux, Active Sinus Disease, Alpha 1 Antitripsin deficiency, Anxiety masquerading as Airways dz,  ABPA,  allergy(esp in young), Aspiration (esp in elderly), Adverse effects of DPI,  Active smokers, plus two Bs  = Bronchiectasis and Beta blocker use..and one C= CHF  ? Acid (or non-acid) GERD > always difficult to exclude as up to 75% of pts in some series report no assoc GI/ Heartburn symptoms> rec continue  max (24h)  acid suppression and diet restrictions/ reviewed and instructions given in writting   ? Adverse effect of meds > try just spiriva and prn saba (sometimes less is more)  The proper method of use, as well as anticipated side effects, of a metered-dose dry powder  inhaler are discussed and demonstrated to the patient. Improved effectiveness after extensive coaching during this visit to a level of approximately  90%  ? Adverse effect of meds > try off macrodantin though no convincing evidence of il on serial cxr   ? Anxiety/ depression/ deconditioning > dx of exclusion  ? chf > cxr suggests this is better .

## 2013-09-11 NOTE — Progress Notes (Signed)
Subjective:    Patient ID: [name], female    DOB: 1922/08/17, 79 y.o.   MRN: [id number]  HPI  14 yowf quit smoking 2004 in SNF x April 2009 with baseline  doe x across room x 02/2013 referred  09/11/2013 to pulmonary clinic by Dr Jacky Kindle for copd eval .  09/11/2013 1st [facility] office visit/ Latrese Carolan cc now on 02  24 /7 x 6 months on macrodantin progressive doe = even on 02 to sob x across her room to BR and no assoc cough. Comfortable flat and lying down  rx with mutiple nebs/saba/sama/lama "help x 15 min" only  No obvious day to day or daytime variabilty or assoc chronic cough or cp or chest tightness, subjective wheeze overt sinus symptoms. No unusual exp hx or h/o childhood pna/ asthma or knowledge of premature birth.  Sleeping ok without nocturnal  or early am exacerbation  of respiratory  c/o's or need for noct saba. Also denies any obvious fluctuation of symptoms with weather or environmental changes or other aggravating or alleviating factors except as outlined above   Current Medications, Allergies, Complete Past Medical History, Past Surgical History, Family History, and Social History were reviewed in [employer] record.  ROS  The following are not active complaints unless bolded sore throat, dysphagia, dental problems, itching, sneezing,  nasal congestion or excess/ purulent secretions, ear ache,   fever, chills, sweats, unintended wt loss, pleuritic or exertional cp, hemoptysis,  orthopnea pnd or leg swelling, presyncope, palpitations, heartburn, abdominal pain, anorexia, nausea, vomiting, diarrhea  or change in bowel or urinary habits, change in stools or urine, dysuria,hematuria,  rash, arthralgias, visual complaints, headache, numbness weakness or ataxia or problems with walking or coordination,  change in mood/affect or memory.        Review of Systems  Constitutional: Positive for unexpected weight change. Negative for fever and chills.   HENT: Positive for trouble swallowing. Negative for congestion, dental problem, ear pain, nosebleeds, postnasal drip, rhinorrhea, sinus pressure, sneezing, sore throat and voice change.   Eyes: Negative for visual disturbance.  Respiratory: Positive for cough and shortness of breath. Negative for choking.   Cardiovascular: Negative for chest pain and leg swelling.  Gastrointestinal: Positive for abdominal pain. Negative for vomiting and diarrhea.  Genitourinary: Negative for difficulty urinating.       Acid heartburn  Musculoskeletal: Negative for arthralgias.  Skin: Negative for rash.  Neurological: Positive for headaches. Negative for tremors and syncope.  Hematological: Does not bruise/bleed easily.       Objective:   Physical Exam  W/c bound elderly wf with hopeless affect  Wt Readings from Last 3 Encounters:  09/11/13 117 lb (53.071 kg)  09/03/13 120 lb 12.8 oz (54.795 kg)  08/12/13 118 lb (53.524 kg)      HEENT mild turbinate edema.  Oropharynx no thrush or excess pnd or cobblestoning.  No JVD or cervical adenopathy. Mild accessory muscle hypertrophy. Trachea midline, nl thryroid. Chest was hyperinflated by percussion with diminished breath sounds and moderate increased exp time without wheeze. Hoover sign positive at mid inspiration. Regular rate and rhythm without murmur gallop or rub or increase P2 or edema.  Abd: no hsm, nl excursion. Ext warm without cyanosis or clubbing.      04/14/12 Cardiomegaly, vascular congestion and small bilateral effusions.   CXR  09/11/2013 : Hyperexpanded lungs with suspected minimal increase in small left-sided effusion with associated left basilar opacities, atelectasis versus infiltrate. 2. Interval reduction in persistent  trace right-sided effusion.      Assessment & Plan:

## 2013-09-11 NOTE — Assessment & Plan Note (Signed)
02 2lpm 24/7 at SNF> no change rx

## 2013-09-11 NOTE — Patient Instructions (Addendum)
Stop macrodantin, duoneb and atrovent Continue spiriva each am  Only use your albuterol (xopenex) as a rescue medication to be used if you can't catch your breath by resting or doing a relaxed purse lip breathing pattern.  - The less you use it, the better it will work when you need it. - Ok to use up to every 4 hours if you must but call for immediate appointment if use goes up over your usual need  Continue 02 at 2lpm at rest but may need to be adjusted up with activity   Please remember to go to the   x-ray department downstairs for your tests - we will call you with the results when they are available.   Please schedule a follow up office visit in 4 weeks, sooner if needed with pfts on return

## 2013-09-12 NOTE — Progress Notes (Signed)
Quick Note:  Advised janet at facility of CXR results per MW. She verbalized understanding and requested it be faxed. This faxed to facility ______

## 2013-09-17 ENCOUNTER — Ambulatory Visit: Payer: Medicare Other | Admitting: Nurse Practitioner

## 2013-09-19 ENCOUNTER — Ambulatory Visit: Payer: Medicare Other | Admitting: Nurse Practitioner

## 2013-10-14 ENCOUNTER — Encounter: Payer: Self-pay | Admitting: *Deleted

## 2013-10-14 ENCOUNTER — Ambulatory Visit (INDEPENDENT_AMBULATORY_CARE_PROVIDER_SITE_OTHER): Payer: PRIVATE HEALTH INSURANCE | Admitting: Nurse Practitioner

## 2013-10-14 VITALS — BP 110/68 | HR 96 | Ht 64.0 in | Wt 117.0 lb

## 2013-10-14 DIAGNOSIS — R112 Nausea with vomiting, unspecified: Secondary | ICD-10-CM

## 2013-10-14 DIAGNOSIS — R634 Abnormal weight loss: Secondary | ICD-10-CM

## 2013-10-14 DIAGNOSIS — R1084 Generalized abdominal pain: Secondary | ICD-10-CM

## 2013-10-14 DIAGNOSIS — R109 Unspecified abdominal pain: Secondary | ICD-10-CM

## 2013-10-14 MED ORDER — METOCLOPRAMIDE HCL 5 MG PO TABS: ORAL_TABLET | ORAL | Status: DC

## 2013-10-14 NOTE — Progress Notes (Signed)
     History of Present Illness:  Patient is an 79 year old female on remotely to Dr.. She has a history of adenomatous colon polyps and diverticulosis (2006). I saw the patient in August of this year for evaluation of dysphagia, diffuse abdominal pain as well as intermittent nausea and vomiting. I scheduled the patient for an upper endoscopy but in the meantime she return to clinic for ongoing symptoms. Patient was advised to keep her appointment for upper endoscopy. We added zofran for nausea and a CT scan of the abdomen and pelvis with contrast which showed vascular disease but no obvious etiology of gastrointestinal complaints.  Upper endoscopy 08/12/13 showed benign esophageal stricturing which was dilated with a 54 [place] Maloney dilator.    Patient is referred back today for ongoing dysphagia, intermittent nausea, vomiting and abdominal pain. Patient evaluated at [facility] where she resides. KUB revealed "mild ileus, improved" and mild to moderate constipation. Ultrasound on 11/8  unremarkable. Amylase was 157, lipase normal. She reports vomiting 2-3 times a week, often in am before breakfast. She complains of ongoing weight loss. Weight in Feb 2014 was 128, it is 116 today. Bowels are moving okay. Her abdominal pain starts in upper abdomen but eventually becomes diffuse to involve whole abdomen. Pain not necessarily related to meals.    Current Medications, Allergies, Past Medical History, Past Surgical History, Family History and Social History were reviewed in [employer] record.  Physical Exam: General: Pleasant, well developed , white female in wheelchair in no acute distress Head: Normocephalic and atraumatic Eyes:  sclerae anicteric, conjunctiva pink  Ears: Normal auditory acuity Lungs: Clear throughout to auscultation Heart: Regular rate and rhythm Abdomen: Soft, non distended, non-tender. No masses, no hepatomegaly. Normal bowel  sounds Musculoskeletal: Symmetrical with no gross deformities  Extremities: No edema  Neurological: Alert oriented x 4, grossly nonfocal Psychological:  Alert and cooperative. Normal mood and affect  Assessment and Recommendations:  1. Chronic nausea, vomiting, abdominal pain (predominantly diffuse upper abdomen). EGD unrevealing except for benign esophageal stricture. CTscan with contrast negative for acute abnormalities. Ultrasound unrevealing. Previous labs unrevealing except for amylase of 157 in setting of normal lipase. CTscan suggests some atrophy of pancreas. Etiology of ongoing symptoms unclear. Symptoms predate initiation of Macrodantin though the medication may certainly exacerbate the nausea. Not sure how much norco she uses but that can exacerbate the nausea as well. Will cautiously proceed with a trial of low dose reglan at bedtime to see if this improves anything. She will follow up with Dr. Marina Goodell in three weeks.   2. Persistent intermittent dysphagia. Esophageal dilation didn't help. Suspect dysmotility. We discussed barium esophogram but not sure that it would change management and she understand this. Recommend continuing small bites of food with increased sips of fluid in between bites.   3. Weight loss. Not drastic but she does have documented  loss of 12 pounds since February. Not sure how much more workup she should undergo. She does have multiple medical problems and is on multiple medications all of which may be affecting her appetite.

## 2013-10-14 NOTE — Patient Instructions (Signed)
We sent a prescription for Reglan 5 mg. [facility] [place], [place]. Please stop the medication if you have any involuntary muscle movements.  Also stop if you have increased anxiousness.  We made you a follow up appointment with Dr. Yancey Flemings on 10-29-2013 at 8:30 am.

## 2013-10-15 NOTE — Progress Notes (Signed)
Agree with initial assessment and plans. Overall, not thriving. No clear-cut obvious primary GI problem, rather GI symptom complex. Low dose Reglan, but use cautiously. She will followup in a few weeks. Not sure what else to offer.

## 2013-10-16 ENCOUNTER — Ambulatory Visit (INDEPENDENT_AMBULATORY_CARE_PROVIDER_SITE_OTHER): Payer: PRIVATE HEALTH INSURANCE | Admitting: Internal Medicine

## 2013-10-16 ENCOUNTER — Encounter: Payer: Self-pay | Admitting: Internal Medicine

## 2013-10-16 VITALS — BP 118/60 | HR 90 | Temp 97.6°F | Ht 61.5 in | Wt 115.0 lb

## 2013-10-16 DIAGNOSIS — J9610 Chronic respiratory failure, unspecified whether with hypoxia or hypercapnia: Secondary | ICD-10-CM

## 2013-10-16 DIAGNOSIS — J449 Chronic obstructive pulmonary disease, unspecified: Secondary | ICD-10-CM

## 2013-10-16 DIAGNOSIS — I5032 Chronic diastolic (congestive) heart failure: Secondary | ICD-10-CM

## 2013-10-16 NOTE — Progress Notes (Signed)
PFT done. Jennifer Castillo, CMA  

## 2013-10-16 NOTE — Patient Instructions (Addendum)
Continue spiriva  Add Advair 250 one twice daily   and as needed albuterol or xopenex up to every 4 hours for breathlessness/ wheeze  We need to be sure you are not getting any macrodantin (it needs to be stopped permanently)   02 should be 2lpm 24/7 so you should be provided a portable system when not connected to your house 02   Pulmonary follow up is as needed

## 2013-10-16 NOTE — Progress Notes (Signed)
Subjective:    Patient ID: [name], female    DOB: May 20, 1922, 79 y.o.   MRN: [id number]    Brief patient profile:  89 yowf quit smoking 2004 in SNF x April 2009 with baseline  doe x across room x 02/2013 referred  09/11/2013 to pulmonary clinic by Dr Jacky Kindle for copd eval .   History of Present Illness   09/11/2013 1st [facility] office visit/ [name] cc now on 02  24 /7 x 6 months on macrodantin progressive doe = even on 02 to sob x across her room to BR and no assoc cough. Comfortable flat and lying down  rx with mutiple nebs/saba/sama/lama "help x 15 min" only rec Stop macrodantin, duoneb and atrovent Continue spiriva each am  Only use your albuterol (xopenex)  prn Continue 02 at 2lpm at rest but may need to be adjusted up with activity  10/16/2013 f/u ov/Sebastiano Luecke re: chronic resp failure  Chief Complaint  Patient presents with  . Followup with PFT    Pt states DOE and cough are unchanged since her last visit. She is requesting portable o2.    she is already on portable 02 but wants a portable concentrator and is mostly w/c bound when she goes out at baseline    No obvious day to day or daytime variabilty or assoc chronic cough or cp or chest tightness, subjective wheeze overt sinus symptoms. No unusual exp hx or h/o childhood pna/ asthma or knowledge of premature birth.  Sleeping ok without nocturnal  or early am exacerbation  of respiratory  c/o's or need for noct saba. Also denies any obvious fluctuation of symptoms with weather or environmental changes or other aggravating or alleviating factors except as outlined above   Current Medications, Allergies, Complete Past Medical History, Past Surgical History, Family History, and Social History were reviewed in [employer] record.  ROS  The following are not active complaints unless bolded sore throat, dysphagia, dental problems, itching, sneezing,  nasal congestion or excess/ purulent secretions,  ear ache,   fever, chills, sweats, unintended wt loss, pleuritic or exertional cp, hemoptysis,  orthopnea pnd or leg swelling, presyncope, palpitations, heartburn, abdominal pain, anorexia, nausea, vomiting, diarrhea  or change in bowel or urinary habits, change in stools or urine, dysuria,hematuria,  rash, arthralgias, visual complaints, headache, numbness weakness or ataxia or problems with walking or coordination,  change in mood/affect or memory.              Objective:   Physical Exam  W/c bound elderly wf nad on 02   10/16/2013     115  Wt Readings from Last 3 Encounters:  09/11/13 117 lb (53.071 kg)  09/03/13 120 lb 12.8 oz (54.795 kg)  08/12/13 118 lb (53.524 kg)      HEENT mild turbinate edema.  Oropharynx no thrush or excess pnd or cobblestoning.  No JVD or cervical adenopathy. Mild accessory muscle hypertrophy. Trachea midline, nl thryroid. Chest was hyperinflated by percussion with diminished breath sounds and moderate increased exp time without wheeze but a few insp crackles bilaterally . Hoover sign positive at mid inspiration. Regular rate and rhythm without murmur gallop or rub or increase P2 - wearing teds with Pos pitting edema.  Abd: no hsm, nl excursion. Ext warm without cyanosis or clubbing.         CXR  09/11/2013 : Hyperexpanded lungs with suspected minimal increase in small left-sided effusion with associated left basilar opacities, atelectasis versus infiltrate. 2. Interval  reduction in persistent trace right-sided effusion.      Assessment & Plan:

## 2013-10-18 ENCOUNTER — Encounter: Payer: Self-pay | Admitting: Internal Medicine

## 2013-10-19 NOTE — Assessment & Plan Note (Signed)
-   PFT's 10/16/2013 FEV1  1.08 ( 78%) ratio 60 and 13% better p B2 with DLCO 28 corrects to 43%  Based on reversibility despite maint spiriva ok to try advair and if better consider stop spiriva as I doubt needs both

## 2013-10-19 NOTE — Assessment & Plan Note (Signed)
She is already on 02 2lpm 24/7 at [facility] and mostly w/c bound but needs to be provided with portable system when not on house 02; if fact, may need to be titrated up with exertion  In view of severe reduction in dlco I would not risk using macrodantin in this pt though no def evidence of macrodantin pulmonary toxicity at this point

## 2013-10-19 NOTE — Assessment & Plan Note (Signed)
Echo 01/29/13 - The patient was in atrial fibrillation. Normal LV size and systolic function, EF 60-65%. Normal RV size and systolic function. Moderate biatrial enlargement. Moderate pulmonary hypertension.  rx per cards, 02 24/7 should help

## 2013-10-27 ENCOUNTER — Ambulatory Visit: Payer: PRIVATE HEALTH INSURANCE | Admitting: Internal Medicine

## 2013-10-29 ENCOUNTER — Encounter (HOSPITAL_COMMUNITY): Payer: Self-pay | Admitting: *Deleted

## 2013-10-29 ENCOUNTER — Ambulatory Visit (INDEPENDENT_AMBULATORY_CARE_PROVIDER_SITE_OTHER): Payer: PRIVATE HEALTH INSURANCE | Admitting: Internal Medicine

## 2013-10-29 ENCOUNTER — Encounter (HOSPITAL_COMMUNITY): Payer: Self-pay | Admitting: Pharmacy Technician

## 2013-10-29 ENCOUNTER — Encounter: Payer: Self-pay | Admitting: Internal Medicine

## 2013-10-29 VITALS — BP 144/60 | HR 80 | Ht 61.5 in | Wt 118.4 lb

## 2013-10-29 DIAGNOSIS — K219 Gastro-esophageal reflux disease without esophagitis: Secondary | ICD-10-CM

## 2013-10-29 DIAGNOSIS — K222 Esophageal obstruction: Secondary | ICD-10-CM

## 2013-10-29 DIAGNOSIS — R1310 Dysphagia, unspecified: Secondary | ICD-10-CM

## 2013-10-29 DIAGNOSIS — R112 Nausea with vomiting, unspecified: Secondary | ICD-10-CM

## 2013-10-29 DIAGNOSIS — R109 Unspecified abdominal pain: Secondary | ICD-10-CM

## 2013-10-29 NOTE — Patient Instructions (Addendum)
You have been scheduled for an endoscopy with propofol at [facility] Please follow written instructions given to you at your visit today. If you use inhalers (even only as needed), please bring them with you on the day of your procedure.  As Discussed with Dr. Marina Goodell, diiscontinue Reglan

## 2013-10-29 NOTE — Progress Notes (Signed)
HISTORY OF PRESENT ILLNESS:  [name] is a 79 y.o. female with multiple significant medical problems as listed below. She has been seen in this office in recent months regarding chronic upper abdominal pain, dysphagia, and intermittent nausea with vomiting. She has had an extensive workup with the only significant abnormality being esophageal stricturing for which she underwent EGD with dilation 08/12/2013. She continues to reside at a nursing home. She is on continuous oxygen. She is accompanied today by a nursing aide. She was last seen in this office 10/14/2013 with ongoing complaints. See that dictation. She was placed on metoclopramide. No change in symptoms. Upon questioning today, she continues with intermittent solid food dysphagia. It appears that her nausea with vomiting is directly related to transient food impaction. I tried to review this history with her carefully several times. Terms of abdominal discomfort, she continues to reports vague daily tolerable upper abdominal discomfort. No new complaints in addition to the above.  REVIEW OF SYSTEMS:  All non-GI ROS negative except for sinus and allergy, back pain, headaches, hearing problems, sleeping problems, voice change  Past Medical History  Diagnosis Date  . Falls   . GERD (gastroesophageal reflux disease)   . Rib fractures     left 7th and 9th  . Depression   . Compression fracture     T11 and L1  . CAD (coronary artery disease)     a. s/p PCI to LAD, CFX, RCA in past;  b. Last LHC 11/01: proximal LAD 30-40%, distal LAD 40%, CFX 30-40%, proximal RCA 30%, mid RCA 80-90%, EF 55%.  PCI: Stent to the RCA.;  c. Myoview 2/04: No ischemia or scar, EF 64%.    . Carotid stenosis   . History of stroke   . HTN (hypertension)   . CHF (congestive heart failure)   . Hx of echocardiogram     a. Echo 11/05: EF 55-65%, borderline LVH, mild focal basal septal hypertrophy, mild AI, mild LAE, mild RAE;   b.  Echo 3/14:  EF 60-65%, normal  wall motion, mild AI, MAC, trivial MR, mod BAE, mild to mod TR, PASP 50-54 (mod Pul HTN), small post eff   . PAD (peripheral artery disease)   . COPD (chronic obstructive pulmonary disease)   . DNR (do not resuscitate)   . Hypopotassemia   . Atrial fibrillation   . Difficulty walking     uses wheelchair  . Sciatica   . Disc degeneration   . Spinal stenosis   . Lumbosacral neuritis   . Backache   . Patellar tendinitis   . Muscle weakness (generalized)   . Osteoporosis   . Pathologic fracture of vertebra   . Gait abnormality   . Lack of coordination   . Failure to thrive in adult   . Stroke   . CKD (chronic kidney disease) stage 3, GFR 30-59 ml/min   . Colon polyps 06/01/2005  . Diverticulosis of colon 06/01/2005  . Stricture of esophagus 08/12/2013    Benign    Past Surgical History  Procedure Laterality Date  . Back surgery    . Angioplasty      x3  . Appendectomy    . Abdominal hysterectomy    . Esophagogastroduodenoscopy N/A 08/12/2013    Procedure: ESOPHAGOGASTRODUODENOSCOPY (EGD);  Surgeon: Hilarie Fredrickson, MD;  Location: [facility];  Service: Endoscopy;  Laterality: N/A;    Social History [name]  reports that she quit smoking about 10 years ago. Her smoking use  included Cigarettes. She has a 25 pack-year smoking history. She has never used smokeless tobacco. She reports that she does not drink alcohol or use illicit drugs.  family history includes Heart disease in her mother; Prostate cancer in her father.  Allergies  Allergen Reactions  . Amphotericin B     No reaction stated on MAR  . Bystolic [Nebivolol Hcl]     No reaction stated on MAR  . Codeine     No reaction stated on MAR  . Metoprolol Tartrate     No reaction stated on MAR  . Penicillins     No reaction stated on MAR       PHYSICAL EXAMINATION: Vital signs: BP 144/60  Pulse 80  Ht 5' 1.5" (1.562 m)  Wt 118 lb 6 oz (53.695 kg)  BMI 22.01 kg/m2 General: Frail thin chronically  ill-appearing elderly female., no acute distress. Nasal cannula oxygen in place HEENT: Sclerae are anicteric, conjunctiva pink. Oral mucosa intact. No thrush Lungs: Clear Heart: Regular Abdomen: soft, nontender, nondistended, no obvious ascites, no peritoneal signs, normal bowel sounds. No organomegaly. Extremities: No edema Psychiatric: alert and oriented x3. Cooperative   ASSESSMENT:  #1. Dysphagia. It seems that her principal problem is ongoing intermittent solid food dysphagia with transient food impaction. She did not receive benefit from her recent [facility] dilation. I think it is worth 1 additional attempt, anyway, dilation to see if we can relieve this symptoms. I did talk to her about soft mechanical diet as an option. She is not in favor of this at all. She states "I like my meat". #2. GERD. #3. Nausea and vomiting. May be related to transient food impaction based on her history. Otherwise, nausea and vomiting outside of that particular setting is almost certainly due to a combination of multiple significant medical problems and polypharmacy rather than a primary GI disorder #4. Chronic stable upper abdominal discomfort. Tolerable. Negative extensive workup. Nothing further to do  PLAN:  #1. Upper endoscopy with Savary guidewire assisted dilation of the esophagus. The patient is very high risk, but understands this, due to her comorbidities.The nature of the procedure, as well as the risks, benefits, and alternatives were carefully and thoroughly reviewed with the patient. Ample time for discussion and questions allowed. The patient understood, was satisfied, and agreed to proceed. This will be performed at the hospital with CRNA supervised propofol administration. #2. Discontinue metoclopramide #3. Continue PPI #4. Ongoing general medical care with PCP and other specialists

## 2013-10-30 ENCOUNTER — Encounter: Payer: Self-pay | Admitting: Internal Medicine

## 2013-10-30 NOTE — Progress Notes (Signed)
Faxed and pre op instructions to [facility], spoke with andronia williams lpn, all instructions received and fax and understood.

## 2013-10-31 ENCOUNTER — Other Ambulatory Visit: Payer: Self-pay

## 2013-10-31 DIAGNOSIS — R1310 Dysphagia, unspecified: Secondary | ICD-10-CM

## 2013-11-04 ENCOUNTER — Encounter (HOSPITAL_COMMUNITY)
Admission: RE | Disposition: A | Discharge status: Nursing Home (Includes ICF) | Payer: Self-pay | Source: Ambulatory Visit | Attending: Internal Medicine

## 2013-11-04 ENCOUNTER — Encounter (HOSPITAL_COMMUNITY): Payer: PRIVATE HEALTH INSURANCE | Admitting: Anesthesiology

## 2013-11-04 ENCOUNTER — Ambulatory Visit (HOSPITAL_COMMUNITY)
Admission: RE | Admit: 2013-11-04 | Discharge: 2013-11-04 | Disposition: A | Discharge status: Nursing Home (Includes ICF) | Payer: PRIVATE HEALTH INSURANCE | Source: Ambulatory Visit | Attending: Internal Medicine | Admitting: Internal Medicine

## 2013-11-04 ENCOUNTER — Ambulatory Visit (HOSPITAL_COMMUNITY): Payer: PRIVATE HEALTH INSURANCE

## 2013-11-04 ENCOUNTER — Encounter (HOSPITAL_COMMUNITY): Payer: Self-pay | Admitting: Internal Medicine

## 2013-11-04 ENCOUNTER — Ambulatory Visit (HOSPITAL_COMMUNITY): Payer: PRIVATE HEALTH INSURANCE | Admitting: Anesthesiology

## 2013-11-04 DIAGNOSIS — Z8673 Personal history of transient ischemic attack (TIA), and cerebral infarction without residual deficits: Secondary | ICD-10-CM | POA: Insufficient documentation

## 2013-11-04 DIAGNOSIS — N183 Chronic kidney disease, stage 3 unspecified: Secondary | ICD-10-CM | POA: Insufficient documentation

## 2013-11-04 DIAGNOSIS — K222 Esophageal obstruction: Secondary | ICD-10-CM | POA: Insufficient documentation

## 2013-11-04 DIAGNOSIS — I509 Heart failure, unspecified: Secondary | ICD-10-CM | POA: Insufficient documentation

## 2013-11-04 DIAGNOSIS — I4891 Unspecified atrial fibrillation: Secondary | ICD-10-CM | POA: Insufficient documentation

## 2013-11-04 DIAGNOSIS — K219 Gastro-esophageal reflux disease without esophagitis: Secondary | ICD-10-CM | POA: Insufficient documentation

## 2013-11-04 DIAGNOSIS — K259 Gastric ulcer, unspecified as acute or chronic, without hemorrhage or perforation: Secondary | ICD-10-CM | POA: Insufficient documentation

## 2013-11-04 DIAGNOSIS — I129 Hypertensive chronic kidney disease with stage 1 through stage 4 chronic kidney disease, or unspecified chronic kidney disease: Secondary | ICD-10-CM | POA: Insufficient documentation

## 2013-11-04 DIAGNOSIS — I2510 Atherosclerotic heart disease of native coronary artery without angina pectoris: Secondary | ICD-10-CM | POA: Insufficient documentation

## 2013-11-04 DIAGNOSIS — J449 Chronic obstructive pulmonary disease, unspecified: Secondary | ICD-10-CM | POA: Insufficient documentation

## 2013-11-04 DIAGNOSIS — J4489 Other specified chronic obstructive pulmonary disease: Secondary | ICD-10-CM | POA: Insufficient documentation

## 2013-11-04 DIAGNOSIS — Z87891 Personal history of nicotine dependence: Secondary | ICD-10-CM | POA: Insufficient documentation

## 2013-11-04 DIAGNOSIS — K449 Diaphragmatic hernia without obstruction or gangrene: Secondary | ICD-10-CM | POA: Insufficient documentation

## 2013-11-04 HISTORY — DX: Dependence on supplemental oxygen: Z99.81

## 2013-11-04 HISTORY — PX: SAVORY DILATION: SHX5439

## 2013-11-04 HISTORY — PX: ESOPHAGOGASTRODUODENOSCOPY: SHX5428

## 2013-11-04 HISTORY — DX: Urinary tract infection, site not specified: N39.0

## 2013-11-04 SURGERY — EGD (ESOPHAGOGASTRODUODENOSCOPY)
Anesthesia: Monitor Anesthesia Care

## 2013-11-04 MED ORDER — PROPOFOL 10 MG/ML IV BOLUS
INTRAVENOUS | Status: DC | PRN
  Administered 2013-11-04 (×2): 10 mg via INTRAVENOUS

## 2013-11-04 MED ORDER — LIDOCAINE HCL (CARDIAC) 20 MG/ML IV SOLN
INTRAVENOUS | Status: DC | PRN
  Administered 2013-11-04: 40 mg via INTRAVENOUS

## 2013-11-04 MED ORDER — GLYCOPYRROLATE 0.2 MG/ML IJ SOLN
INTRAMUSCULAR | Status: AC
  Filled 2013-11-04: qty 1

## 2013-11-04 MED ORDER — LIDOCAINE HCL (CARDIAC) 20 MG/ML IV SOLN
INTRAVENOUS | Status: AC
  Filled 2013-11-04: qty 5

## 2013-11-04 MED ORDER — ONDANSETRON HCL 4 MG/2ML IJ SOLN
INTRAMUSCULAR | Status: DC | PRN
  Administered 2013-11-04: 4 mg via INTRAVENOUS

## 2013-11-04 MED ORDER — ONDANSETRON HCL 4 MG/2ML IJ SOLN
INTRAMUSCULAR | Status: AC
  Filled 2013-11-04: qty 2

## 2013-11-04 MED ORDER — GLYCOPYRROLATE 0.2 MG/ML IJ SOLN
INTRAMUSCULAR | Status: DC | PRN
  Administered 2013-11-04: 0.2 mg via INTRAVENOUS

## 2013-11-04 MED ORDER — PROPOFOL 10 MG/ML IV BOLUS
INTRAVENOUS | Status: AC
  Filled 2013-11-04: qty 20

## 2013-11-04 MED ORDER — LACTATED RINGERS IV SOLN
INTRAVENOUS | Status: DC | PRN
  Administered 2013-11-04: 14:00:00 via INTRAVENOUS

## 2013-11-04 MED ORDER — KETAMINE HCL 50 MG/ML IJ SOLN
INTRAMUSCULAR | Status: DC | PRN
  Administered 2013-11-04: 25 mg via INTRAMUSCULAR

## 2013-11-04 MED ORDER — PROPOFOL INFUSION 10 MG/ML OPTIME
INTRAVENOUS | Status: DC | PRN
  Administered 2013-11-04: 50 ug/kg/min via INTRAVENOUS

## 2013-11-04 NOTE — Op Note (Signed)
[facility] [address] [place], [address]   ENDOSCOPY PROCEDURE REPORT  PATIENT: Olivia, [name]  MR#: [id number] BIRTHDATE: 13-Jul-1922 , 89  yrs. old GENDER: Female ENDOSCOPIST: Roxy Cedar, MD REFERRED BY:  .  Self / Office PROCEDURE DATE:  11/04/2013 PROCEDURE:  Savary dilation of esophagus  -18 mm ASA CLASS:     Class III INDICATIONS:  Dysphagia.   Therapeutic procedure. MEDICATIONS: MAC sedation, administered by CRNA and See Anesthesia Report. TOPICAL ANESTHETIC: Cetacaine Spray  DESCRIPTION OF PROCEDURE: After the risks benefits and alternatives of the procedure were thoroughly explained, informed consent was obtained.  The    therapeutic upper endoscope was introduced through the mouth and advanced to the second portion of the duodenum. Without limitations.  The instrument was slowly withdrawn as the mucosa was fully examined.    Exam:The esophagus revealed several moderately large caliber rings. The stomach revealed a posterior gastric ulcer approximately 5 mm. The duodenum was normal.  Retroflexed views revealed a hiatal hernia.  Therapy: A Savary guidewire was placed into the gastric antrum under fluoroscopic control. Subsequently, an 18 mm dilator was passed over the wire. Fluoroscopy was used to assist throughout. Minimal resistance. No heme. Tolerated well  COMPLICATIONS: There were no complications. ENDOSCOPIC IMPRESSION: 1. Esophageal stricture status post dilation 2. Incidental small gastric ulcer without stigmata  RECOMMENDATIONS: 1.  Clear liquids until 4 PM, then soft foods rest of day.  Resume prior diet tomorrow. 2.  Continue PPI therapy. Avoid unnecessary NSAIDs 3. Return to the care of your primary providers. GI followup as needed  REPEAT EXAM:  eSigned:  Roxy Cedar, MD 11/04/2013 2:40 PM   CC:The Patient and Geoffry Paradise, MD

## 2013-11-04 NOTE — Preoperative (Signed)
Beta Blockers   Reason not to administer Beta Blockers:Not Applicable 

## 2013-11-04 NOTE — Transfer of Care (Signed)
Immediate Anesthesia Transfer of Care Note  Patient: [name]  Procedure(s) Performed: Procedure(s) (LRB): ESOPHAGOGASTRODUODENOSCOPY (EGD) (N/A) SAVORY DILATION (N/A)  Patient Location: PACU  Anesthesia Type: MAC  Level of Consciousness: sedated, patient cooperative and responds to stimulation  Airway & Oxygen Therapy: Patient Spontanous Breathing and Patient connected to face mask oxgen  Post-op Assessment: Report given to PACU RN and Post -op Vital signs reviewed and stable  Post vital signs: Reviewed and stable  Complications: No apparent anesthesia complications

## 2013-11-04 NOTE — Anesthesia Preprocedure Evaluation (Addendum)
Anesthesia Evaluation  Patient identified by MRN, date of birth, ID band Patient awake    Reviewed: Allergy & Precautions, H&P , NPO status , Patient's Chart, lab work & pertinent test results  Airway Mallampati: II TM Distance: >3 FB Neck ROM: Full    Dental no notable dental hx.    Pulmonary COPD oxygen dependent, former smoker,  breath sounds clear to auscultation  Pulmonary exam normal       Cardiovascular hypertension, Pt. on medications + CAD, + Cardiac Stents and + Peripheral Vascular Disease + dysrhythmias Atrial Fibrillation Rhythm:Regular Rate:Normal     Neuro/Psych CVA negative psych ROS   GI/Hepatic negative GI ROS, Neg liver ROS,   Endo/Other  negative endocrine ROS  Renal/GU Renal InsufficiencyRenal disease  negative genitourinary   Musculoskeletal negative musculoskeletal ROS (+)   Abdominal   Peds negative pediatric ROS (+)  Hematology negative hematology ROS (+)   Anesthesia Other Findings   Reproductive/Obstetrics negative OB ROS                         Anesthesia Physical Anesthesia Plan  ASA: III  Anesthesia Plan: MAC   Post-op Pain Management:    Induction: Intravenous  Airway Management Planned: Nasal Cannula  Additional Equipment:   Intra-op Plan:   Post-operative Plan:   Informed Consent: I have reviewed the patients History and Physical, chart, labs and discussed the procedure including the risks, benefits and alternatives for the proposed anesthesia with the patient or authorized representative who has indicated his/her understanding and acceptance.   Dental advisory given  Plan Discussed with: CRNA and Surgeon  Anesthesia Plan Comments:         Anesthesia Quick Evaluation

## 2013-11-04 NOTE — Interval H&P Note (Signed)
History and Physical Interval Note:  No interval change. Proceed with EGD with dilation  11/04/2013 1:28 PM  [name]  has presented today for surgery, with the diagnosis of dysphagia  The various methods of treatment have been discussed with the patient and family. After consideration of risks, benefits and other options for treatment, the patient has consented to  Procedure(s): ESOPHAGOGASTRODUODENOSCOPY (EGD) (N/A) SAVORY DILATION (N/A) as a surgical intervention .  The patient's history has been reviewed, patient examined, no change in status, stable for surgery.  I have reviewed the patient's chart and labs.  Questions were answered to the patient's satisfaction.     Yancey Flemings

## 2013-11-04 NOTE — H&P (View-Only) (Signed)
HISTORY OF PRESENT ILLNESS:  [name] is a 79 y.o. female with multiple significant medical problems as listed below. She has been seen in this office in recent months regarding chronic upper abdominal pain, dysphagia, and intermittent nausea with vomiting. She has had an extensive workup with the only significant abnormality being esophageal stricturing for which she underwent EGD with dilation 08/12/2013. She continues to reside at a nursing home. She is on continuous oxygen. She is accompanied today by a nursing aide. She was last seen in this office 10/14/2013 with ongoing complaints. See that dictation. She was placed on metoclopramide. No change in symptoms. Upon questioning today, she continues with intermittent solid food dysphagia. It appears that her nausea with vomiting is directly related to transient food impaction. I tried to review this history with her carefully several times. Terms of abdominal discomfort, she continues to reports vague daily tolerable upper abdominal discomfort. No new complaints in addition to the above.  REVIEW OF SYSTEMS:  All non-GI ROS negative except for sinus and allergy, back pain, headaches, hearing problems, sleeping problems, voice change  Past Medical History  Diagnosis Date  . Falls   . GERD (gastroesophageal reflux disease)   . Rib fractures     left 7th and 9th  . Depression   . Compression fracture     T11 and L1  . CAD (coronary artery disease)     a. s/p PCI to LAD, CFX, RCA in past;  b. Last LHC 11/01: proximal LAD 30-40%, distal LAD 40%, CFX 30-40%, proximal RCA 30%, mid RCA 80-90%, EF 55%.  PCI: Stent to the RCA.;  c. Myoview 2/04: No ischemia or scar, EF 64%.    . Carotid stenosis   . History of stroke   . HTN (hypertension)   . CHF (congestive heart failure)   . Hx of echocardiogram     a. Echo 11/05: EF 55-65%, borderline LVH, mild focal basal septal hypertrophy, mild AI, mild LAE, mild RAE;   b.  Echo 3/14:  EF 60-65%, normal  wall motion, mild AI, MAC, trivial MR, mod BAE, mild to mod TR, PASP 50-54 (mod Pul HTN), small post eff   . PAD (peripheral artery disease)   . COPD (chronic obstructive pulmonary disease)   . DNR (do not resuscitate)   . Hypopotassemia   . Atrial fibrillation   . Difficulty walking     uses wheelchair  . Sciatica   . Disc degeneration   . Spinal stenosis   . Lumbosacral neuritis   . Backache   . Patellar tendinitis   . Muscle weakness (generalized)   . Osteoporosis   . Pathologic fracture of vertebra   . Gait abnormality   . Lack of coordination   . Failure to thrive in adult   . Stroke   . CKD (chronic kidney disease) stage 3, GFR 30-59 ml/min   . Colon polyps 06/01/2005  . Diverticulosis of colon 06/01/2005  . Stricture of esophagus 08/12/2013    Benign    Past Surgical History  Procedure Laterality Date  . Back surgery    . Angioplasty      x3  . Appendectomy    . Abdominal hysterectomy    . Esophagogastroduodenoscopy N/A 08/12/2013    Procedure: ESOPHAGOGASTRODUODENOSCOPY (EGD);  Surgeon: Cheyla Duchemin N Lynnita Somma, MD;  Location: [facility];  Service: Endoscopy;  Laterality: N/A;    Social History [name]  reports that she quit smoking about 10 years ago. Her smoking use   included Cigarettes. She has a 25 pack-year smoking history. She has never used smokeless tobacco. She reports that she does not drink alcohol or use illicit drugs.  family history includes Heart disease in her mother; Prostate cancer in her father.  Allergies  Allergen Reactions  . Amphotericin B     No reaction stated on MAR  . Bystolic [Nebivolol Hcl]     No reaction stated on MAR  . Codeine     No reaction stated on MAR  . Metoprolol Tartrate     No reaction stated on MAR  . Penicillins     No reaction stated on MAR       PHYSICAL EXAMINATION: Vital signs: BP 144/60  Pulse 80  Ht 5' 1.5" (1.562 m)  Wt 118 lb 6 oz (53.695 kg)  BMI 22.01 kg/m2 General: Frail thin chronically  ill-appearing elderly female., no acute distress. Nasal cannula oxygen in place HEENT: Sclerae are anicteric, conjunctiva pink. Oral mucosa intact. No thrush Lungs: Clear Heart: Regular Abdomen: soft, nontender, nondistended, no obvious ascites, no peritoneal signs, normal bowel sounds. No organomegaly. Extremities: No edema Psychiatric: alert and oriented x3. Cooperative   ASSESSMENT:  #1. Dysphagia. It seems that her principal problem is ongoing intermittent solid food dysphagia with transient food impaction. She did not receive benefit from her recent Maloney dilation. I think it is worth 1 additional attempt, anyway, dilation to see if we can relieve this symptoms. I did talk to her about soft mechanical diet as an option. She is not in favor of this at all. She states "I like my meat". #2. GERD. #3. Nausea and vomiting. May be related to transient food impaction based on her history. Otherwise, nausea and vomiting outside of that particular setting is almost certainly due to a combination of multiple significant medical problems and polypharmacy rather than a primary GI disorder #4. Chronic stable upper abdominal discomfort. Tolerable. Negative extensive workup. Nothing further to do  PLAN:  #1. Upper endoscopy with Savary guidewire assisted dilation of the esophagus. The patient is very high risk, but understands this, due to her comorbidities.The nature of the procedure, as well as the risks, benefits, and alternatives were carefully and thoroughly reviewed with the patient. Ample time for discussion and questions allowed. The patient understood, was satisfied, and agreed to proceed. This will be performed at the hospital with CRNA supervised propofol administration. #2. Discontinue metoclopramide #3. Continue PPI #4. Ongoing general medical care with PCP and other specialists 

## 2013-11-04 NOTE — Anesthesia Postprocedure Evaluation (Signed)
  Anesthesia Post-op Note  Patient: [name]  Procedure(s) Performed: Procedure(s) (LRB): ESOPHAGOGASTRODUODENOSCOPY (EGD) (N/A) SAVORY DILATION (N/A)  Patient Location: PACU  Anesthesia Type: MAC  Level of Consciousness: awake and alert   Airway and Oxygen Therapy: Patient Spontanous Breathing  Post-op Pain: mild  Post-op Assessment: Post-op Vital signs reviewed, Patient's Cardiovascular Status Stable, Respiratory Function Stable, Patent Airway and No signs of Nausea or vomiting  Last Vitals:  Filed Vitals:   11/04/13 1445  BP:   Pulse:   Temp: 36.9 C    Post-op Vital Signs: stable   Complications: No apparent anesthesia complications

## 2013-11-05 ENCOUNTER — Encounter (HOSPITAL_COMMUNITY): Payer: Self-pay | Admitting: Internal Medicine

## 2013-12-23 LAB — PULMONARY FUNCTION TEST
DL/VA % pred: 43 %
DL/VA: 1.96 ml/min/mmHg/L
DLCO unc % pred: 28 %
DLCO unc: 6.05 ml/min/mmHg
FEF 25-75 Post: 0.71 L/sec
FEF 25-75 Pre: 0.44 L/sec
FEF2575-%Change-Post: 61 %
FEF2575-%Pred-Post: 91 %
FEF2575-%Pred-Pre: 56 %
FEV1-%Change-Post: 13 %
FEV1-%Pred-Post: 88 %
FEV1-%Pred-Pre: 78 %
FEV1-Post: 1.22 L
FEV1-Pre: 1.08 L
FEV1FVC-%Change-Post: 11 %
FEV1FVC-%Pred-Pre: 83 %
FEV6-%Change-Post: 4 %
FEV6-%Pred-Post: 104 %
FEV6-%Pred-Pre: 100 %
FEV6-Post: 1.82 L
FEV6-Pre: 1.75 L
FEV6FVC-%Change-Post: 1 %
FEV6FVC-%Pred-Post: 107 %
FEV6FVC-%Pred-Pre: 105 %
FVC-%Change-Post: 2 %
FVC-%Pred-Post: 96 %
FVC-%Pred-Pre: 94 %
FVC-Post: 1.82 L
Post FEV1/FVC ratio: 67 %
Post FEV6/FVC ratio: 100 %
Pre FEV1/FVC ratio: 60 %
Pre FEV6/FVC Ratio: 98 %
RV % pred: 100 %
RV: 2.46 L
TLC % pred: 90 %
TLC: 4.26 L

## 2014-02-05 ENCOUNTER — Ambulatory Visit (INDEPENDENT_AMBULATORY_CARE_PROVIDER_SITE_OTHER): Payer: PRIVATE HEALTH INSURANCE | Admitting: Podiatry

## 2014-02-05 ENCOUNTER — Encounter: Payer: Self-pay | Admitting: Podiatry

## 2014-02-05 VITALS — BP 147/74 | HR 70 | Resp 16

## 2014-02-05 DIAGNOSIS — M79609 Pain in unspecified limb: Secondary | ICD-10-CM

## 2014-02-05 DIAGNOSIS — B351 Tinea unguium: Secondary | ICD-10-CM

## 2014-02-07 NOTE — Progress Notes (Signed)
Subjective:     Patient ID: [name], female   DOB: 1922/11/26, 80 y.o.   MRN: [id number]  HPI patient presents with painful nail bed bilateral that are impossible for the patient to cut   Review of Systems     Objective:   Physical Exam Neurovascular status intact with thick incurvated nail bed and pain 1-5 both feet    Assessment:     Mycotic nail infection with pain 1-5 both feet    Plan:     Debridement painful nail bed 1-5 both feet with no bleeding noted

## 2014-03-23 ENCOUNTER — Encounter: Payer: Self-pay | Admitting: Physician Assistant

## 2014-03-23 ENCOUNTER — Ambulatory Visit (INDEPENDENT_AMBULATORY_CARE_PROVIDER_SITE_OTHER): Payer: PRIVATE HEALTH INSURANCE | Admitting: Physician Assistant

## 2014-03-23 VITALS — BP 120/60 | HR 83 | Ht 61.0 in | Wt 113.0 lb

## 2014-03-23 DIAGNOSIS — N189 Chronic kidney disease, unspecified: Secondary | ICD-10-CM

## 2014-03-23 DIAGNOSIS — I10 Essential (primary) hypertension: Secondary | ICD-10-CM

## 2014-03-23 DIAGNOSIS — I679 Cerebrovascular disease, unspecified: Secondary | ICD-10-CM

## 2014-03-23 DIAGNOSIS — I5032 Chronic diastolic (congestive) heart failure: Secondary | ICD-10-CM

## 2014-03-23 DIAGNOSIS — I4891 Unspecified atrial fibrillation: Secondary | ICD-10-CM

## 2014-03-23 DIAGNOSIS — I2510 Atherosclerotic heart disease of native coronary artery without angina pectoris: Secondary | ICD-10-CM

## 2014-03-23 NOTE — Progress Notes (Signed)
[address] [place], [place]  [address] Phone: [phone] Fax:  [phone]  Date:  03/23/2014   ID:  [name], DOB 1922/04/17, MRN [id number]  PCP:  Minda MeoARONSON,RICHARD A, MD  Cardiologist:  Dr. Olga MillersBrian Crenshaw      History of Present Illness: [name] is a 80 y.o. female with a hx of prior stenting to the LAD, CFX and RCA, carotid stenosis, HTN, RAS, PAD, COPD, diastolic CHF, CKD, permanent AFib (not a candidate for coumadin).  She is a DNR.  She stays at [facility].  Last seen by Dr. Olga MillersBrian Crenshaw in 08/2013.    She returns for follow up. She is, for the most part, wheelchair-bound. She denies any significant dyspnea. She has occasional left-sided chest discomfort. She has noticed this for years without significant change. She denies any associated symptoms. She denies syncope. She denies orthopnea. She notes significantly improved LE edema  Studies:  - LHC (09/2000):  prox LAD 30-40%, dist LAD 40%, CFX 30-40%, prox 30%, mid RCA 80-90%.  EF 55%.  PCI:  BMS to RCA.  - Echo (01/2013):  EF 60-65%, no RWMA, mild AI, MAC, mod LAE, mod RAE, mild to mod TR, PASP 50-54 mmHg, small post eff.  - Myoview (2/04):  No ischemia or scar, EF 64%.    Recent Labs: 09/03/2013: Creatinine 1.7*; Potassium 4.8; Pro B Natriuretic peptide (BNP) 1280.0* 02/24/2014: Potassium 4.1, BUN 33, creatinine 1.40  Wt Readings from Last 3 Encounters:  03/23/14 113 lb (51.256 kg)  11/04/13 118 lb (53.524 kg)  11/04/13 118 lb (53.524 kg)     Past Medical History  Diagnosis Date  . Falls   . GERD (gastroesophageal reflux disease)   . Rib fractures     left 7th and 9th  . Depression   . Compression fracture     T11 and L1  . CAD (coronary artery disease)     a. s/p PCI to LAD, CFX, RCA in past;  b. Last LHC 11/01: proximal LAD 30-40%, distal LAD 40%, CFX 30-40%, proximal RCA 30%, mid RCA 80-90%, EF 55%.  PCI: Stent to the RCA.;  c. Myoview 2/04: No ischemia or scar, EF 64%.    .  Carotid stenosis   . History of stroke   . HTN (hypertension)   . CHF (congestive heart failure)   . Hx of echocardiogram     a. Echo 11/05: EF 55-65%, borderline LVH, mild focal basal septal hypertrophy, mild AI, mild LAE, mild RAE;   b.  Echo 3/14:  EF 60-65%, normal wall motion, mild AI, MAC, trivial MR, mod BAE, mild to mod TR, PASP 50-54 (mod Pul HTN), small post eff   . PAD (peripheral artery disease)   . COPD (chronic obstructive pulmonary disease)   . DNR (do not resuscitate)   . Hypopotassemia   . Atrial fibrillation   . Difficulty walking     uses wheelchair  . Sciatica   . Disc degeneration   . Spinal stenosis   . Lumbosacral neuritis   . Backache   . Patellar tendinitis   . Muscle weakness (generalized)   . Osteoporosis   . Pathologic fracture of vertebra   . Gait abnormality   . Lack of coordination   . Failure to thrive in adult   . Stroke   . Colon polyps 06/01/2005  . Diverticulosis of colon 06/01/2005  . Stricture of esophagus 08/12/2013    Benign  . CKD (chronic kidney disease) stage  3, GFR 30-59 ml/min   . On home oxygen therapy     wears oxygen 3 liters per [place] per 10-02-13 monthly nursing home note  . Chronic UTI     Current Outpatient Prescriptions  Medication Sig Dispense Refill  . aspirin 81 MG chewable tablet Chew 81 mg by mouth daily.      . Calcium Carbonate-Vitamin D (OS-CAL 500 + D PO) Take 1 tablet by mouth 2 (two) times daily.      . Cholecalciferol (VITAMIN D-3 PO) Take 1,000 Units by mouth every morning.       . Cranberry 475 MG CAPS Take 475 mg by mouth 2 (two) times daily.      . cycloSPORINE (RESTASIS) 0.05 % ophthalmic emulsion Place 1 drop into both eyes 2 (two) times daily.      Marland Kitchen. docusate sodium (COLACE) 100 MG capsule Take 100 mg by mouth 2 (two) times daily.       Marland Kitchen. ENSURE PLUS (ENSURE PLUS) LIQD Take 237 mLs by mouth 3 (three) times daily between meals.      Marland Kitchen. escitalopram (LEXAPRO) 20 MG tablet Take 20 mg by mouth every morning.       . fluticasone (FLONASE) 50 MCG/ACT nasal spray Place 2 sprays into the nose daily.      . Fluticasone-Salmeterol (ADVAIR DISKUS) 250-50 MCG/DOSE AEPB Inhale 1 puff into the lungs 2 (two) times daily.      . furosemide (LASIX) 80 MG tablet Take 80 mg by mouth 2 (two) times daily.      Marland Kitchen. gabapentin (NEURONTIN) 300 MG capsule Take 300 mg by mouth at bedtime.      Marland Kitchen. guaiFENesin (MUCINEX) 600 MG 12 hr tablet Take 600 mg by mouth 2 (two) times daily.      Marland Kitchen. HYDROcodone-acetaminophen (NORCO) 10-325 MG per tablet Take 1 tablet by mouth 2 (two) times daily. Scheduled twice daily but may take every 6 hours if needed for pain      . isosorbide mononitrate (IMDUR) 30 MG 24 hr tablet Take 30 mg by mouth every morning.       . lactobacillus acidophilus & bulgar (LACTINEX) chewable tablet Chew 1 tablet by mouth daily.      Marland Kitchen. levalbuterol (XOPENEX) 1.25 MG/0.5ML nebulizer solution Take 1.25 mg by nebulization 3 (three) times daily as needed for wheezing or shortness of breath.      . levothyroxine (SYNTHROID, LEVOTHROID) 88 MCG tablet Take 88 mcg by mouth daily before breakfast.       . LORazepam (ATIVAN) 0.5 MG tablet Take 0.5 mg by mouth every 6 (six) hours as needed. For anxiety      . losartan (COZAAR) 100 MG tablet Take 100 mg by mouth every morning.       . magnesium hydroxide (MILK OF MAGNESIA) 400 MG/5ML suspension Take 30 mLs by mouth daily as needed for mild constipation.      . ondansetron (ZOFRAN) 4 MG tablet Take 4 mg by mouth every 6 (six) hours as needed for nausea or vomiting.      . OXYGEN-HELIUM IN Inhale 2 L into the lungs as needed.      . pantoprazole (PROTONIX) 40 MG tablet 2 (two) times daily.       . polyethylene glycol (MIRALAX / GLYCOLAX) packet Take 17 g by mouth every other day.       . potassium chloride SA (K-DUR,KLOR-CON) 20 MEQ tablet Take 20 mEq by mouth every morning.       .Marland Kitchen  sennosides-docusate sodium (SENOKOT-S) 8.6-50 MG tablet Take 1 tablet by mouth daily.      .  Simethicone (GAS-X EXTRA STRENGTH) 125 MG CAPS Take 1 capsule by mouth daily as needed (Flatulence).       . sodium chloride (OCEAN) 0.65 % SOLN nasal spray Place 1 spray into the nose 4 (four) times daily.      Marland Kitchen tiotropium (SPIRIVA) 18 MCG inhalation capsule Place 1 capsule (18 mcg total) into inhaler and inhale daily.      . Travoprost, BAK Free, (TRAVATAN) 0.004 % SOLN ophthalmic solution Place 1 drop into both eyes at bedtime.      . vitamin C (ASCORBIC ACID) 500 MG tablet Take 500 mg by mouth 2 (two) times daily.       No current facility-administered medications for this visit.    Allergies:   Amphotericin b; Bystolic; Codeine; Metoprolol tartrate; and Penicillins   Social History:  The patient  reports that she quit smoking about 11 years ago. Her smoking use included Cigarettes. She has a 25 pack-year smoking history. She has never used smokeless tobacco. She reports that she does not drink alcohol or use illicit drugs.   Family History:  The patient's family history includes Heart disease in her mother; Prostate cancer in her father.   ROS:  Please see the history of present illness.      All other systems reviewed and negative.   PHYSICAL EXAM: VS:  BP 120/60  Pulse 83  Ht 5\' 1"  (1.549 m)  Wt 113 lb (51.256 kg)  BMI 21.36 kg/m2 Well nourished, well developed, in no acute distress HEENT: normal Neck: no JVDat 90 Cardiac:  normal S1, S2; irregularly irregular; no murmur Lungs:  clear to auscultation bilaterally, no wheezing, rhonchi or rales Abd: soft, nontender, no hepatomegaly Ext: no edema Skin: warm and dry Neuro:  CNs 2-12 intact, no focal abnormalities noted   EKG:  Atrial fibrillation, HR 83, normal axis, nonspecific ST-T wave changes   ASSESSMENT AND PLAN:  1. Chronic diastolic heart failure: Volume remains stable. Continue current therapy. Recent creatinine stable 2. Coronary atherosclerosis of native coronary artery: No angina. Continue aspirin,  nitrates 3. Atrial fibrillation: Rate is controlled. Continue aspirin. She is not a candidate for Coumadin. 4. HYPERTENSION, UNSPECIFIED: Controlled. 5. CKD (chronic kidney disease): Stable but recent measurements. 6. CEREBROVASCULAR DISEASE: Continue aspirin. 7. Disposition: Follow up with Dr. Jens Som in 6 months.  Signed, Tereso Newcomer, PA-C  03/23/2014 3:50 PM

## 2014-03-23 NOTE — Patient Instructions (Signed)
NO CHANGES WERE MADE TODAY WITH MEDICATIONS  Your physician wants you to follow-up in: 6 MONTHS WITH DR. CRENSHAW. You will receive a reminder letter in the mail two months in advance. If you don't receive a letter, please call our office to schedule the follow-up appointment.

## 2014-05-14 ENCOUNTER — Encounter: Payer: Self-pay | Admitting: Podiatry

## 2014-05-14 ENCOUNTER — Ambulatory Visit (INDEPENDENT_AMBULATORY_CARE_PROVIDER_SITE_OTHER): Payer: PRIVATE HEALTH INSURANCE | Admitting: Podiatry

## 2014-05-14 VITALS — BP 118/65 | HR 96 | Resp 12

## 2014-05-14 DIAGNOSIS — M79673 Pain in unspecified foot: Secondary | ICD-10-CM

## 2014-05-14 DIAGNOSIS — B351 Tinea unguium: Secondary | ICD-10-CM

## 2014-05-14 DIAGNOSIS — M79609 Pain in unspecified limb: Secondary | ICD-10-CM

## 2014-05-14 NOTE — Progress Notes (Signed)
Subjective:     Patient ID: [name], female   DOB: 1922-07-20, 80 y.o.   MRN: [id number]  HPI patient presents with thick yellow nailbeds 1-5 both feet that she cannot cut and air painful   Review of Systems     Objective:   Physical Exam Neurovascular unchanged with thick yellow brittle nailbeds 1-5 both feet that are painful when pressed dorsally    Assessment:     Mycotic nail infection with pain 1-5 both feet    Plan:     Debris painful nailbeds 1-5 both feet with no iatrogenic bleeding noted

## 2014-08-27 ENCOUNTER — Ambulatory Visit (INDEPENDENT_AMBULATORY_CARE_PROVIDER_SITE_OTHER): Payer: PRIVATE HEALTH INSURANCE | Admitting: Podiatry

## 2014-08-27 DIAGNOSIS — M79673 Pain in unspecified foot: Secondary | ICD-10-CM

## 2014-08-27 DIAGNOSIS — B351 Tinea unguium: Secondary | ICD-10-CM

## 2014-08-27 NOTE — Progress Notes (Signed)
   Subjective:    Patient ID: [name], female    DOB: 1922/01/02, 80 y.o.   MRN: [id number]  HPI  Pt present for nail debridement  Review of Systems     Objective:   Physical Exam        Assessment & Plan:

## 2014-08-30 NOTE — Progress Notes (Signed)
Subjective:     Patient ID: [name], female   DOB: June 12, 1922, 80 y.o.   MRN: [id number]  HPI patient presents stating that my nails are thick and they become painful in shoe gear   Review of Systems     Objective:   Physical Exam Neurovascular status intact with thick yellow brittle nailbeds 1-5 both feet that are painful when pressed    Assessment:     Mycotic nail infection with pain 1-5 both feet    Plan:     Debride painful nailbeds 1-5 both feet with no iatrogenic bleeding noted

## 2014-09-17 ENCOUNTER — Telehealth: Payer: Self-pay | Admitting: Cardiology

## 2014-09-21 NOTE — Telephone Encounter (Signed)
Closed encounter °

## 2014-09-22 NOTE — Progress Notes (Signed)
HPI: FU diastolic CHF; also with history of prior stenting to the LAD, CFX and RCA, carotid stenosis, HTN, RAS, PAD, COPD, diastolic CHF, CKD, permanent AFib (not a candidate for coumadin). Since last seen,  Studies:  - LHC (09/2000): prox LAD 30-40%, dist LAD 40%, CFX 30-40%, prox 30%, mid RCA 80-90%. EF 55%. PCI: BMS to RCA.  - Echo (01/2013): EF 60-65%, no RWMA, mild AI, MAC, mod LAE, mod RAE, mild to mod TR, PASP 50-54 mmHg, small post eff.  - Myoview (2/04): No ischemia or scar, EF 64%.    Current Outpatient Prescriptions  Medication Sig Dispense Refill  . acetaminophen (TYLENOL) 325 MG tablet Take 650 mg by mouth every 4 (four) hours as needed.      Marland Kitchen. aspirin 81 MG chewable tablet Chew 81 mg by mouth daily.      . Calcium Carbonate-Vitamin D (OS-CAL 500 + D PO) Take 1 tablet by mouth 2 (two) times daily.      . Cholecalciferol (VITAMIN D-3 PO) Take 1,000 Units by mouth every morning.       . Cranberry 475 MG CAPS Take 475 mg by mouth 2 (two) times daily.      . cycloSPORINE (RESTASIS) 0.05 % ophthalmic emulsion Place 1 drop into both eyes 2 (two) times daily.      Marland Kitchen. docusate sodium (COLACE) 100 MG capsule Take 100 mg by mouth 2 (two) times daily.       Marland Kitchen. ENSURE PLUS (ENSURE PLUS) LIQD Take 237 mLs by mouth 3 (three) times daily between meals.      Marland Kitchen. escitalopram (LEXAPRO) 20 MG tablet Take 20 mg by mouth every morning.      . fesoterodine (TOVIAZ) 4 MG TB24 tablet Take 4 mg by mouth daily.      . fluticasone (FLONASE) 50 MCG/ACT nasal spray Place 2 sprays into the nose daily.      . Fluticasone-Salmeterol (ADVAIR DISKUS) 250-50 MCG/DOSE AEPB Inhale 1 puff into the lungs 2 (two) times daily.      . furosemide (LASIX) 80 MG tablet Take 80 mg by mouth 2 (two) times daily.      Marland Kitchen. gabapentin (NEURONTIN) 300 MG capsule Take 300 mg by mouth at bedtime.      Marland Kitchen. guaiFENesin (MUCINEX) 600 MG 12 hr tablet Take 600 mg by mouth 2 (two) times daily.      Marland Kitchen. HYDROcodone-acetaminophen (NORCO)  10-325 MG per tablet Take 1 tablet by mouth 2 (two) times daily. Scheduled twice daily but may take every 6 hours if needed for pain      . isosorbide mononitrate (IMDUR) 30 MG 24 hr tablet Take 30 mg by mouth every morning.       . lactobacillus acidophilus & bulgar (LACTINEX) chewable tablet Chew 1 tablet by mouth daily.      Marland Kitchen. levalbuterol (XOPENEX) 1.25 MG/0.5ML nebulizer solution Take 1.25 mg by nebulization 3 (three) times daily as needed for wheezing or shortness of breath.      . levothyroxine (SYNTHROID, LEVOTHROID) 75 MCG tablet Take 75 mcg by mouth daily before breakfast.      . LORazepam (ATIVAN) 0.5 MG tablet Take 0.5 mg by mouth every 6 (six) hours as needed. For anxiety      . losartan (COZAAR) 100 MG tablet Take 100 mg by mouth every morning.       . magnesium hydroxide (MILK OF MAGNESIA) 400 MG/5ML suspension Take 30 mLs by mouth daily as needed for mild  constipation.      . ondansetron (ZOFRAN) 4 MG tablet Take 4 mg by mouth every 6 (six) hours as needed for nausea or vomiting.      . OXYGEN-HELIUM IN Inhale 2 L into the lungs as needed.      . pantoprazole (PROTONIX) 40 MG tablet 2 (two) times daily.       . polyethylene glycol (MIRALAX / GLYCOLAX) packet Take 17 g by mouth every other day.       . potassium chloride SA (K-DUR,KLOR-CON) 20 MEQ tablet Take 20 mEq by mouth every morning.       . sennosides-docusate sodium (SENOKOT-S) 8.6-50 MG tablet Take 1 tablet by mouth daily.      . Simethicone (GAS-X EXTRA STRENGTH) 125 MG CAPS Take 1 capsule by mouth daily as needed (Flatulence).       . sodium chloride (OCEAN) 0.65 % SOLN nasal spray Place 1 spray into the nose 4 (four) times daily.      Marland Kitchen. tiotropium (SPIRIVA) 18 MCG inhalation capsule Place 1 capsule (18 mcg total) into inhaler and inhale daily.      . Travoprost, BAK Free, (TRAVATAN) 0.004 % SOLN ophthalmic solution Place 1 drop into both eyes at bedtime.      . vitamin C (ASCORBIC ACID) 500 MG tablet Take 500 mg by mouth  2 (two) times daily.       No current facility-administered medications for this visit.     Past Medical History  Diagnosis Date  . Falls   . GERD (gastroesophageal reflux disease)   . Rib fractures     left 7th and 9th  . Depression   . Compression fracture     T11 and L1  . CAD (coronary artery disease)     a. s/p PCI to LAD, CFX, RCA in past;  b. Last LHC 11/01: proximal LAD 30-40%, distal LAD 40%, CFX 30-40%, proximal RCA 30%, mid RCA 80-90%, EF 55%.  PCI: Stent to the RCA.;  c. Myoview 2/04: No ischemia or scar, EF 64%.    . Carotid stenosis   . History of stroke   . HTN (hypertension)   . CHF (congestive heart failure)   . Hx of echocardiogram     a. Echo 11/05: EF 55-65%, borderline LVH, mild focal basal septal hypertrophy, mild AI, mild LAE, mild RAE;   b.  Echo 3/14:  EF 60-65%, normal wall motion, mild AI, MAC, trivial MR, mod BAE, mild to mod TR, PASP 50-54 (mod Pul HTN), small post eff   . PAD (peripheral artery disease)   . COPD (chronic obstructive pulmonary disease)   . DNR (do not resuscitate)   . Hypopotassemia   . Atrial fibrillation   . Difficulty walking     uses wheelchair  . Sciatica   . Disc degeneration   . Spinal stenosis   . Lumbosacral neuritis   . Backache   . Patellar tendinitis   . Muscle weakness (generalized)   . Osteoporosis   . Pathologic fracture of vertebra   . Gait abnormality   . Lack of coordination   . Failure to thrive in adult   . Stroke   . Colon polyps 06/01/2005  . Diverticulosis of colon 06/01/2005  . Stricture of esophagus 08/12/2013    Benign  . CKD (chronic kidney disease) stage 3, GFR 30-59 ml/min   . On home oxygen therapy     wears oxygen 3 liters per [place] per 10-02-13 monthly nursing home note  .  Chronic UTI     Past Surgical History  Procedure Laterality Date  . Back surgery    . Angioplasty      x3  . Appendectomy    . Abdominal hysterectomy    . Esophagogastroduodenoscopy N/A 08/12/2013    Procedure:  ESOPHAGOGASTRODUODENOSCOPY (EGD);  Surgeon: Hilarie Fredrickson, MD;  Location: [facility];  Service: Endoscopy;  Laterality: N/A;  . Esophagogastroduodenoscopy N/A 11/04/2013    Procedure: ESOPHAGOGASTRODUODENOSCOPY (EGD);  Surgeon: Hilarie Fredrickson, MD;  Location: [facility];  Service: Endoscopy;  Laterality: N/A;  . Savory dilation N/A 11/04/2013    Procedure: SAVORY DILATION;  Surgeon: Hilarie Fredrickson, MD;  Location: [facility];  Service: Endoscopy;  Laterality: N/A;    History   Social History  . Marital Status: Widowed    Spouse Name: N/A    Number of Children: 1  . Years of Education: N/A   Occupational History  . Retired    Social History Main Topics  . Smoking status: Former Smoker -- 0.50 packs/day for 50 years    Types: Cigarettes    Quit date: 11/27/2002  . Smokeless tobacco: Never Used  . Alcohol Use: No  . Drug Use: No  . Sexual Activity: Not Currently   Other Topics Concern  . Not on file   Social History Narrative  . No narrative on file    ROS: no fevers or chills, productive cough, hemoptysis, dysphasia, odynophagia, melena, hematochezia, dysuria, hematuria, rash, seizure activity, orthopnea, PND, pedal edema, claudication. Remaining systems are negative.  Physical Exam: Well-developed well-nourished in no acute distress.  Skin is warm and dry.  HEENT is normal.  Neck is supple.  Chest is clear to auscultation with normal expansion.  Cardiovascular exam is regular rate and rhythm.  Abdominal exam nontender or distended. No masses palpated. Extremities show no edema. neuro grossly intact  ECG     This encounter was created in error - please disregard.

## 2014-09-23 ENCOUNTER — Ambulatory Visit: Payer: Medicaid Other | Admitting: Cardiology

## 2014-09-24 ENCOUNTER — Encounter: Payer: Medicaid Other | Admitting: Cardiology

## 2014-10-13 NOTE — Progress Notes (Signed)
HPI: FU CAD; h/o stenting of LAD, CFX and RCA, carotid stenosis, HTN, RAS, PAD, COPD, diastolic CHF, CKD, permanent AFib (not a candidate for coumadin). She is a DNR. She stays at [facility].Since last seen,   Studies: - LHC (09/2000): prox LAD 30-40%, dist LAD 40%, CFX 30-40%, prox 30%, mid RCA 80-90%. EF 55%. PCI: BMS to RCA. - Echo (01/2013): EF 60-65%, no RWMA, mild AI, MAC, mod LAE, mod RAE, mild to mod TR, PASP 50-54 mmHg, small post eff. - Myoview (2/04): No ischemia or scar, EF 64%.   Current Outpatient Prescriptions  Medication Sig Dispense Refill  . acetaminophen (TYLENOL) 325 MG tablet Take 650 mg by mouth every 4 (four) hours as needed.    Marland Kitchen. aspirin 81 MG chewable tablet Chew 81 mg by mouth daily.    . Calcium Carbonate-Vitamin D (OS-CAL 500 + D PO) Take 1 tablet by mouth 2 (two) times daily.    . Cholecalciferol (VITAMIN D-3 PO) Take 1,000 Units by mouth every morning.     . Cranberry 475 MG CAPS Take 475 mg by mouth 2 (two) times daily.    . cycloSPORINE (RESTASIS) 0.05 % ophthalmic emulsion Place 1 drop into both eyes 2 (two) times daily.    Marland Kitchen. docusate sodium (COLACE) 100 MG capsule Take 100 mg by mouth 2 (two) times daily.     Marland Kitchen. ENSURE PLUS (ENSURE PLUS) LIQD Take 237 mLs by mouth 3 (three) times daily between meals.    Marland Kitchen. escitalopram (LEXAPRO) 20 MG tablet Take 20 mg by mouth every morning.    . fesoterodine (TOVIAZ) 4 MG TB24 tablet Take 4 mg by mouth daily.    . fluticasone (FLONASE) 50 MCG/ACT nasal spray Place 2 sprays into the nose daily.    . Fluticasone-Salmeterol (ADVAIR DISKUS) 250-50 MCG/DOSE AEPB Inhale 1 puff into the lungs 2 (two) times daily.    . furosemide (LASIX) 80 MG tablet Take 80 mg by mouth 2 (two) times daily.    Marland Kitchen. gabapentin (NEURONTIN) 300 MG capsule Take 300 mg by mouth at bedtime.    Marland Kitchen. guaiFENesin (MUCINEX) 600 MG 12 hr tablet Take 600 mg by mouth 2 (two) times daily.    Marland Kitchen. HYDROcodone-acetaminophen (NORCO) 10-325 MG per tablet  Take 1 tablet by mouth 2 (two) times daily. Scheduled twice daily but may take every 6 hours if needed for pain    . isosorbide mononitrate (IMDUR) 30 MG 24 hr tablet Take 30 mg by mouth every morning.     . lactobacillus acidophilus & bulgar (LACTINEX) chewable tablet Chew 1 tablet by mouth daily.    Marland Kitchen. levalbuterol (XOPENEX) 1.25 MG/0.5ML nebulizer solution Take 1.25 mg by nebulization 3 (three) times daily as needed for wheezing or shortness of breath.    . levothyroxine (SYNTHROID, LEVOTHROID) 75 MCG tablet Take 75 mcg by mouth daily before breakfast.    . LORazepam (ATIVAN) 0.5 MG tablet Take 0.5 mg by mouth every 6 (six) hours as needed. For anxiety    . losartan (COZAAR) 100 MG tablet Take 100 mg by mouth every morning.     . magnesium hydroxide (MILK OF MAGNESIA) 400 MG/5ML suspension Take 30 mLs by mouth daily as needed for mild constipation.    . ondansetron (ZOFRAN) 4 MG tablet Take 4 mg by mouth every 6 (six) hours as needed for nausea or vomiting.    . OXYGEN-HELIUM IN Inhale 2 L into the lungs as needed.    . pantoprazole (PROTONIX) 40  MG tablet 2 (two) times daily.     . polyethylene glycol (MIRALAX / GLYCOLAX) packet Take 17 g by mouth every other day.     . potassium chloride SA (K-DUR,KLOR-CON) 20 MEQ tablet Take 20 mEq by mouth every morning.     . sennosides-docusate sodium (SENOKOT-S) 8.6-50 MG tablet Take 1 tablet by mouth daily.    . Simethicone (GAS-X EXTRA STRENGTH) 125 MG CAPS Take 1 capsule by mouth daily as needed (Flatulence).     . sodium chloride (OCEAN) 0.65 % SOLN nasal spray Place 1 spray into the nose 4 (four) times daily.    Marland Kitchen tiotropium (SPIRIVA) 18 MCG inhalation capsule Place 1 capsule (18 mcg total) into inhaler and inhale daily.    . Travoprost, BAK Free, (TRAVATAN) 0.004 % SOLN ophthalmic solution Place 1 drop into both eyes at bedtime.    . vitamin C (ASCORBIC ACID) 500 MG tablet Take 500 mg by mouth 2 (two) times daily.     No current  facility-administered medications for this visit.     Past Medical History  Diagnosis Date  . Falls   . GERD (gastroesophageal reflux disease)   . Rib fractures     left 7th and 9th  . Depression   . Compression fracture     T11 and L1  . CAD (coronary artery disease)     a. s/p PCI to LAD, CFX, RCA in past;  b. Last LHC 11/01: proximal LAD 30-40%, distal LAD 40%, CFX 30-40%, proximal RCA 30%, mid RCA 80-90%, EF 55%.  PCI: Stent to the RCA.;  c. Myoview 2/04: No ischemia or scar, EF 64%.    . Carotid stenosis   . History of stroke   . HTN (hypertension)   . CHF (congestive heart failure)   . Hx of echocardiogram     a. Echo 11/05: EF 55-65%, borderline LVH, mild focal basal septal hypertrophy, mild AI, mild LAE, mild RAE;   b.  Echo 3/14:  EF 60-65%, normal wall motion, mild AI, MAC, trivial MR, mod BAE, mild to mod TR, PASP 50-54 (mod Pul HTN), small post eff   . PAD (peripheral artery disease)   . COPD (chronic obstructive pulmonary disease)   . DNR (do not resuscitate)   . Hypopotassemia   . Atrial fibrillation   . Difficulty walking     uses wheelchair  . Sciatica   . Disc degeneration   . Spinal stenosis   . Lumbosacral neuritis   . Backache   . Patellar tendinitis   . Muscle weakness (generalized)   . Osteoporosis   . Pathologic fracture of vertebra   . Gait abnormality   . Lack of coordination   . Failure to thrive in adult   . Stroke   . Colon polyps 06/01/2005  . Diverticulosis of colon 06/01/2005  . Stricture of esophagus 08/12/2013    Benign  . CKD (chronic kidney disease) stage 3, GFR 30-59 ml/min   . On home oxygen therapy     wears oxygen 3 liters per [place] per 10-02-13 monthly nursing home note  . Chronic UTI     Past Surgical History  Procedure Laterality Date  . Back surgery    . Angioplasty      x3  . Appendectomy    . Abdominal hysterectomy    . Esophagogastroduodenoscopy N/A 08/12/2013    Procedure: ESOPHAGOGASTRODUODENOSCOPY (EGD);  Surgeon:  Hilarie Fredrickson, MD;  Location: [facility];  Service: Endoscopy;  Laterality: N/A;  .  Esophagogastroduodenoscopy N/A 11/04/2013    Procedure: ESOPHAGOGASTRODUODENOSCOPY (EGD);  Surgeon: Hilarie FredricksonJohn N Perry, MD;  [facility];  Service: Endoscopy;  Laterality: N/A;  . Savory dilation N/A 11/04/2013    Procedure: SAVORY DILATION;  Surgeon: Hilarie FredricksonJohn N Perry, MD;  [facility];  Service: Endoscopy;  Laterality: N/A;    History   Social History  . Marital Status: Widowed    Spouse Name: N/A    Number of Children: 1  . Years of Education: N/A   Occupational History  . Retired    Social History Main Topics  . Smoking status: Former Smoker -- 0.50 packs/day for 50 years    Types: Cigarettes    Quit date: 11/27/2002  . Smokeless tobacco: Never Used  . Alcohol Use: No  . Drug Use: No  . Sexual Activity: Not Currently   Other Topics Concern  . Not on file   Social History Narrative  . No narrative on file    ROS: no fevers or chills, productive cough, hemoptysis, dysphasia, odynophagia, melena, hematochezia, dysuria, hematuria, rash, seizure activity, orthopnea, PND, pedal edema, claudication. Remaining systems are negative.  Physical Exam: Well-developed well-nourished in no acute distress.  Skin is warm and dry.  HEENT is normal.  Neck is supple.  Chest is clear to auscultation with normal expansion.  Cardiovascular exam is regular rate and rhythm.  Abdominal exam nontender or distended. No masses palpated. Extremities show no edema. neuro grossly intact  ECG     This encounter was created in error - please disregard.

## 2014-10-15 ENCOUNTER — Encounter: Payer: Medicaid Other | Admitting: Cardiology

## 2014-10-16 ENCOUNTER — Telehealth: Payer: Self-pay | Admitting: Cardiology

## 2014-10-20 NOTE — Telephone Encounter (Signed)
Closed encounter °

## 2014-10-29 NOTE — Progress Notes (Signed)
HPI: FU CAD with hx of prior stenting to the LAD, CFX and RCA, carotid stenosis, HTN, RAS, PAD, COPD, diastolic CHF, CKD, permanent AFib (not a candidate for coumadin). She is a DNR. She stays at [facility]. Since last seen,   Studies: - LHC (09/2000): prox LAD 30-40%, dist LAD 40%, CFX 30-40%, prox 30%, mid RCA 80-90%. EF 55%. PCI: BMS to RCA. - Echo (01/2013): EF 60-65%, no RWMA, mild AI, MAC, mod LAE, mod RAE, mild to mod TR, PASP 50-54 mmHg, small post eff. - Myoview (2/04): No ischemia or scar, EF 64%.   Current Outpatient Prescriptions  Medication Sig Dispense Refill  . acetaminophen (TYLENOL) 325 MG tablet Take 650 mg by mouth every 4 (four) hours as needed.    Marland Kitchen. aspirin 81 MG chewable tablet Chew 81 mg by mouth daily.    . Calcium Carbonate-Vitamin D (OS-CAL 500 + D PO) Take 1 tablet by mouth 2 (two) times daily.    . Cholecalciferol (VITAMIN D-3 PO) Take 1,000 Units by mouth every morning.     . Cranberry 475 MG CAPS Take 475 mg by mouth 2 (two) times daily.    . cycloSPORINE (RESTASIS) 0.05 % ophthalmic emulsion Place 1 drop into both eyes 2 (two) times daily.    Marland Kitchen. docusate sodium (COLACE) 100 MG capsule Take 100 mg by mouth 2 (two) times daily.     Marland Kitchen. ENSURE PLUS (ENSURE PLUS) LIQD Take 237 mLs by mouth 3 (three) times daily between meals.    Marland Kitchen. escitalopram (LEXAPRO) 20 MG tablet Take 20 mg by mouth every morning.    . fesoterodine (TOVIAZ) 4 MG TB24 tablet Take 4 mg by mouth daily.    . fluticasone (FLONASE) 50 MCG/ACT nasal spray Place 2 sprays into the nose daily.    . Fluticasone-Salmeterol (ADVAIR DISKUS) 250-50 MCG/DOSE AEPB Inhale 1 puff into the lungs 2 (two) times daily.    . furosemide (LASIX) 80 MG tablet Take 80 mg by mouth 2 (two) times daily.    Marland Kitchen. gabapentin (NEURONTIN) 300 MG capsule Take 300 mg by mouth at bedtime.    Marland Kitchen. guaiFENesin (MUCINEX) 600 MG 12 hr tablet Take 600 mg by mouth 2 (two) times daily.    Marland Kitchen. HYDROcodone-acetaminophen (NORCO)  10-325 MG per tablet Take 1 tablet by mouth 2 (two) times daily. Scheduled twice daily but may take every 6 hours if needed for pain    . isosorbide mononitrate (IMDUR) 30 MG 24 hr tablet Take 30 mg by mouth every morning.     . lactobacillus acidophilus & bulgar (LACTINEX) chewable tablet Chew 1 tablet by mouth daily.    Marland Kitchen. levalbuterol (XOPENEX) 1.25 MG/0.5ML nebulizer solution Take 1.25 mg by nebulization 3 (three) times daily as needed for wheezing or shortness of breath.    . levothyroxine (SYNTHROID, LEVOTHROID) 75 MCG tablet Take 75 mcg by mouth daily before breakfast.    . LORazepam (ATIVAN) 0.5 MG tablet Take 0.5 mg by mouth every 6 (six) hours as needed. For anxiety    . losartan (COZAAR) 100 MG tablet Take 100 mg by mouth every morning.     . magnesium hydroxide (MILK OF MAGNESIA) 400 MG/5ML suspension Take 30 mLs by mouth daily as needed for mild constipation.    . ondansetron (ZOFRAN) 4 MG tablet Take 4 mg by mouth every 6 (six) hours as needed for nausea or vomiting.    . OXYGEN-HELIUM IN Inhale 2 L into the lungs as needed.    .Marland Kitchen  pantoprazole (PROTONIX) 40 MG tablet 2 (two) times daily.     . polyethylene glycol (MIRALAX / GLYCOLAX) packet Take 17 g by mouth every other day.     . potassium chloride SA (K-DUR,KLOR-CON) 20 MEQ tablet Take 20 mEq by mouth every morning.     . sennosides-docusate sodium (SENOKOT-S) 8.6-50 MG tablet Take 1 tablet by mouth daily.    . Simethicone (GAS-X EXTRA STRENGTH) 125 MG CAPS Take 1 capsule by mouth daily as needed (Flatulence).     . sodium chloride (OCEAN) 0.65 % SOLN nasal spray Place 1 spray into the nose 4 (four) times daily.    Marland Kitchen. tiotropium (SPIRIVA) 18 MCG inhalation capsule Place 1 capsule (18 mcg total) into inhaler and inhale daily.    . Travoprost, BAK Free, (TRAVATAN) 0.004 % SOLN ophthalmic solution Place 1 drop into both eyes at bedtime.    . vitamin C (ASCORBIC ACID) 500 MG tablet Take 500 mg by mouth 2 (two) times daily.     No  current facility-administered medications for this visit.     Past Medical History  Diagnosis Date  . Falls   . GERD (gastroesophageal reflux disease)   . Rib fractures     left 7th and 9th  . Depression   . Compression fracture     T11 and L1  . CAD (coronary artery disease)     a. s/p PCI to LAD, CFX, RCA in past;  b. Last LHC 11/01: proximal LAD 30-40%, distal LAD 40%, CFX 30-40%, proximal RCA 30%, mid RCA 80-90%, EF 55%.  PCI: Stent to the RCA.;  c. Myoview 2/04: No ischemia or scar, EF 64%.    . Carotid stenosis   . History of stroke   . HTN (hypertension)   . CHF (congestive heart failure)   . Hx of echocardiogram     a. Echo 11/05: EF 55-65%, borderline LVH, mild focal basal septal hypertrophy, mild AI, mild LAE, mild RAE;   b.  Echo 3/14:  EF 60-65%, normal wall motion, mild AI, MAC, trivial MR, mod BAE, mild to mod TR, PASP 50-54 (mod Pul HTN), small post eff   . PAD (peripheral artery disease)   . COPD (chronic obstructive pulmonary disease)   . DNR (do not resuscitate)   . Hypopotassemia   . Atrial fibrillation   . Difficulty walking     uses wheelchair  . Sciatica   . Disc degeneration   . Spinal stenosis   . Lumbosacral neuritis   . Backache   . Patellar tendinitis   . Muscle weakness (generalized)   . Osteoporosis   . Pathologic fracture of vertebra   . Gait abnormality   . Lack of coordination   . Failure to thrive in adult   . Stroke   . Colon polyps 06/01/2005  . Diverticulosis of colon 06/01/2005  . Stricture of esophagus 08/12/2013    Benign  . CKD (chronic kidney disease) stage 3, GFR 30-59 ml/min   . On home oxygen therapy     wears oxygen 3 liters per [place] per 10-02-13 monthly nursing home note  . Chronic UTI     Past Surgical History  Procedure Laterality Date  . Back surgery    . Angioplasty      x3  . Appendectomy    . Abdominal hysterectomy    . Esophagogastroduodenoscopy N/A 08/12/2013    Procedure: ESOPHAGOGASTRODUODENOSCOPY (EGD);   Surgeon: Hilarie FredricksonJohn N Perry, MD;  Location: [facility];  Service: Endoscopy;  Laterality: N/A;  . Esophagogastroduodenoscopy N/A 11/04/2013    Procedure: ESOPHAGOGASTRODUODENOSCOPY (EGD);  Surgeon: Hilarie Fredrickson, MD;  Location: [facility];  Service: Endoscopy;  Laterality: N/A;  . Savory dilation N/A 11/04/2013    Procedure: SAVORY DILATION;  Surgeon: Hilarie Fredrickson, MD;  Location: [facility];  Service: Endoscopy;  Laterality: N/A;    History   Social History  . Marital Status: Widowed    Spouse Name: N/A    Number of Children: 1  . Years of Education: N/A   Occupational History  . Retired    Social History Main Topics  . Smoking status: Former Smoker -- 0.50 packs/day for 50 years    Types: Cigarettes    Quit date: 11/27/2002  . Smokeless tobacco: Never Used  . Alcohol Use: No  . Drug Use: No  . Sexual Activity: Not Currently   Other Topics Concern  . Not on file   Social History Narrative  . No narrative on file    ROS: no fevers or chills, productive cough, hemoptysis, dysphasia, odynophagia, melena, hematochezia, dysuria, hematuria, rash, seizure activity, orthopnea, PND, pedal edema, claudication. Remaining systems are negative.  Physical Exam: Well-developed well-nourished in no acute distress.  Skin is warm and dry.  HEENT is normal.  Neck is supple.  Chest is clear to auscultation with normal expansion.  Cardiovascular exam is regular rate and rhythm.  Abdominal exam nontender or distended. No masses palpated. Extremities show no edema. neuro grossly intact  ECG     This encounter was created in error - please disregard.

## 2014-10-30 ENCOUNTER — Encounter: Payer: Medicaid Other | Admitting: Cardiology

## 2014-11-30 ENCOUNTER — Other Ambulatory Visit: Payer: BLUE CROSS/BLUE SHIELD

## 2014-12-29 ENCOUNTER — Ambulatory Visit (INDEPENDENT_AMBULATORY_CARE_PROVIDER_SITE_OTHER): Payer: Medicare Other | Admitting: Podiatry

## 2014-12-29 DIAGNOSIS — B351 Tinea unguium: Secondary | ICD-10-CM | POA: Diagnosis not present

## 2014-12-29 DIAGNOSIS — M79673 Pain in unspecified foot: Secondary | ICD-10-CM

## 2014-12-29 NOTE — Progress Notes (Signed)
Subjective:     Patient ID: [name], female   DOB: 02/21/1922, 81 y.o.   MRN: [id number]  HPI patient presents stating that my nails are thick and they become painful in shoe gear   Review of Systems     Objective:   Physical Exam Neurovascular status intact with thick yellow brittle nailbeds 1-5 both feet that are painful when pressed    Assessment:     Mycotic nail infection with pain 1-5 both feet    Plan:     Debride painful nailbeds 1-5 both feet with no iatrogenic bleeding noted      

## 2015-01-02 NOTE — Progress Notes (Signed)
HPI: FU CAD with a hx of prior stenting to the LAD, CFX and RCA, carotid stenosis, HTN, RAS, PAD, COPD, diastolic CHF, CKD, permanent AFib (not a candidate for coumadin). She is a DNR. She stays at [facility].  Since last seen, she denies dyspnea. Occasional brief chest pain but not sustained. No palpitations. She does have some pedal edema. She has fallen 3 times in the past 1 week stating her legs are weak.  Studies: - LHC (09/2000): prox LAD 30-40%, dist LAD 40%, CFX 30-40%, prox 30%, mid RCA 80-90%. EF 55%. PCI: BMS to RCA. - Echo (01/2013): EF 60-65%, no RWMA, mild AI, MAC, mod LAE, mod RAE, mild to mod TR, PASP 50-54 mmHg, small post eff. - Myoview (2/04): No ischemia or scar, EF 64%.   Current Outpatient Prescriptions  Medication Sig Dispense Refill  . acetaminophen (TYLENOL) 325 MG tablet Take 650 mg by mouth every 4 (four) hours as needed.    Marland Kitchen aspirin 81 MG chewable tablet Chew 81 mg by mouth daily.    . Calcium Carbonate-Vitamin D (OS-CAL 500 + D PO) Take 1 tablet by mouth 2 (two) times daily.    . Cholecalciferol (VITAMIN D-3 PO) Take 1,000 Units by mouth every morning.     . Cranberry 475 MG CAPS Take 475 mg by mouth 2 (two) times daily.    . cycloSPORINE (RESTASIS) 0.05 % ophthalmic emulsion Place 1 drop into both eyes 2 (two) times daily.    Marland Kitchen docusate sodium (COLACE) 100 MG capsule Take 100 mg by mouth 2 (two) times daily.     Marland Kitchen ENSURE PLUS (ENSURE PLUS) LIQD Take 237 mLs by mouth 3 (three) times daily between meals.    Marland Kitchen escitalopram (LEXAPRO) 20 MG tablet Take 20 mg by mouth every morning.    . fesoterodine (TOVIAZ) 4 MG TB24 tablet Take 4 mg by mouth daily.    . fluticasone (FLONASE) 50 MCG/ACT nasal spray Place 2 sprays into the nose daily.    . Fluticasone-Salmeterol (ADVAIR DISKUS) 250-50 MCG/DOSE AEPB Inhale 1 puff into the lungs 2 (two) times daily.    . furosemide (LASIX) 80 MG tablet Take 80 mg by mouth 2 (two) times daily.    Marland Kitchen gabapentin  (NEURONTIN) 300 MG capsule Take 300 mg by mouth at bedtime.    Marland Kitchen guaiFENesin (MUCINEX) 600 MG 12 hr tablet Take 600 mg by mouth 2 (two) times daily.    Marland Kitchen HYDROcodone-acetaminophen (NORCO) 10-325 MG per tablet Take 1 tablet by mouth 2 (two) times daily. Scheduled twice daily but may take every 6 hours if needed for pain    . isosorbide mononitrate (IMDUR) 30 MG 24 hr tablet Take 30 mg by mouth every morning.     . lactobacillus acidophilus & bulgar (LACTINEX) chewable tablet Chew 1 tablet by mouth daily.    Marland Kitchen levalbuterol (XOPENEX) 1.25 MG/0.5ML nebulizer solution Take 1.25 mg by nebulization 3 (three) times daily as needed for wheezing or shortness of breath.    . levothyroxine (SYNTHROID, LEVOTHROID) 75 MCG tablet Take 75 mcg by mouth daily before breakfast.    . LORazepam (ATIVAN) 0.5 MG tablet Take 0.5 mg by mouth every 6 (six) hours as needed. For anxiety    . losartan (COZAAR) 100 MG tablet Take 100 mg by mouth every morning.     . magnesium hydroxide (MILK OF MAGNESIA) 400 MG/5ML suspension Take 30 mLs by mouth daily as needed for mild constipation.    . ondansetron (ZOFRAN)  4 MG tablet Take 4 mg by mouth every 6 (six) hours as needed for nausea or vomiting.    . OXYGEN-HELIUM IN Inhale 2 L into the lungs as needed.    . pantoprazole (PROTONIX) 40 MG tablet 2 (two) times daily.     . polyethylene glycol (MIRALAX / GLYCOLAX) packet Take 17 g by mouth every other day.     . potassium chloride SA (K-DUR,KLOR-CON) 20 MEQ tablet Take 20 mEq by mouth every morning.     . sennosides-docusate sodium (SENOKOT-S) 8.6-50 MG tablet Take 1 tablet by mouth daily.    . Simethicone (GAS-X EXTRA STRENGTH) 125 MG CAPS Take 1 capsule by mouth daily as needed (Flatulence).     . sodium chloride (OCEAN) 0.65 % SOLN nasal spray Place 1 spray into the nose 4 (four) times daily.    Marland Kitchen. tiotropium (SPIRIVA) 18 MCG inhalation capsule Place 1 capsule (18 mcg total) into inhaler and inhale daily.    . Travoprost, BAK  Free, (TRAVATAN) 0.004 % SOLN ophthalmic solution Place 1 drop into both eyes at bedtime.    . vitamin C (ASCORBIC ACID) 500 MG tablet Take 500 mg by mouth 2 (two) times daily.     No current facility-administered medications for this visit.     Past Medical History  Diagnosis Date  . Falls   . GERD (gastroesophageal reflux disease)   . Rib fractures     left 7th and 9th  . Depression   . Compression fracture     T11 and L1  . CAD (coronary artery disease)     a. s/p PCI to LAD, CFX, RCA in past;  b. Last LHC 11/01: proximal LAD 30-40%, distal LAD 40%, CFX 30-40%, proximal RCA 30%, mid RCA 80-90%, EF 55%.  PCI: Stent to the RCA.;  c. Myoview 2/04: No ischemia or scar, EF 64%.    . Carotid stenosis   . History of stroke   . HTN (hypertension)   . CHF (congestive heart failure)   . Hx of echocardiogram     a. Echo 11/05: EF 55-65%, borderline LVH, mild focal basal septal hypertrophy, mild AI, mild LAE, mild RAE;   b.  Echo 3/14:  EF 60-65%, normal wall motion, mild AI, MAC, trivial MR, mod BAE, mild to mod TR, PASP 50-54 (mod Pul HTN), small post eff   . PAD (peripheral artery disease)   . COPD (chronic obstructive pulmonary disease)   . DNR (do not resuscitate)   . Hypopotassemia   . Atrial fibrillation   . Difficulty walking     uses wheelchair  . Sciatica   . Disc degeneration   . Spinal stenosis   . Lumbosacral neuritis   . Backache   . Patellar tendinitis   . Muscle weakness (generalized)   . Osteoporosis   . Pathologic fracture of vertebra   . Gait abnormality   . Lack of coordination   . Failure to thrive in adult   . Stroke   . Colon polyps 06/01/2005  . Diverticulosis of colon 06/01/2005  . Stricture of esophagus 08/12/2013    Benign  . CKD (chronic kidney disease) stage 3, GFR 30-59 ml/min   . On home oxygen therapy     wears oxygen 3 liters per [place] per 10-02-13 monthly nursing home note  . Chronic UTI     Past Surgical History  Procedure Laterality Date    . Back surgery    . Angioplasty  x3  . Appendectomy    . Abdominal hysterectomy    . Esophagogastroduodenoscopy N/A 08/12/2013    Procedure: ESOPHAGOGASTRODUODENOSCOPY (EGD);  Surgeon: Hilarie Fredrickson, MD;  Location: [facility];  Service: Endoscopy;  Laterality: N/A;  . Esophagogastroduodenoscopy N/A 11/04/2013    Procedure: ESOPHAGOGASTRODUODENOSCOPY (EGD);  Surgeon: Hilarie Fredrickson, MD;  Location: [facility];  Service: Endoscopy;  Laterality: N/A;  . Savory dilation N/A 11/04/2013    Procedure: SAVORY DILATION;  Surgeon: Hilarie Fredrickson, MD;  Location: [facility];  Service: Endoscopy;  Laterality: N/A;    History   Social History  . Marital Status: Widowed    Spouse Name: N/A    Number of Children: 1  . Years of Education: N/A   Occupational History  . Retired    Social History Main Topics  . Smoking status: Former Smoker -- 0.50 packs/day for 50 years    Types: Cigarettes    Quit date: 11/27/2002  . Smokeless tobacco: Never Used  . Alcohol Use: No  . Drug Use: No  . Sexual Activity: Not Currently   Other Topics Concern  . Not on file   Social History Narrative    ROS: no fevers or chills, productive cough, hemoptysis, dysphasia, odynophagia, melena, hematochezia, dysuria, hematuria, rash, seizure activity, orthopnea, PND, claudication. Remaining systems are negative.  Physical Exam: Well-developed frail in no acute distress.  Skin is warm and dry.  HEENT is normal.  Neck is supple.  Chest is clear to auscultation with normal expansion.  Cardiovascular exam is irregular and tachycardic Abdominal exam nontender or distended. No masses palpated. Extremities show 1+ edema. neuro grossly intact  ECG atrial fibrillation with rapid ventricular response at 118. Inferior lateral T-wave inversion. Right axis deviation.

## 2015-01-04 ENCOUNTER — Encounter: Payer: Self-pay | Admitting: Cardiology

## 2015-01-04 ENCOUNTER — Ambulatory Visit (INDEPENDENT_AMBULATORY_CARE_PROVIDER_SITE_OTHER): Payer: PRIVATE HEALTH INSURANCE | Admitting: Cardiology

## 2015-01-04 ENCOUNTER — Telehealth: Payer: Self-pay | Admitting: Cardiology

## 2015-01-04 VITALS — BP 110/50 | HR 118 | Ht 61.0 in | Wt 111.4 lb

## 2015-01-04 DIAGNOSIS — I10 Essential (primary) hypertension: Secondary | ICD-10-CM | POA: Diagnosis not present

## 2015-01-04 DIAGNOSIS — I4891 Unspecified atrial fibrillation: Secondary | ICD-10-CM | POA: Diagnosis not present

## 2015-01-04 DIAGNOSIS — I2510 Atherosclerotic heart disease of native coronary artery without angina pectoris: Secondary | ICD-10-CM

## 2015-01-04 MED ORDER — METOPROLOL SUCCINATE ER 25 MG PO TB24: 12.5000 mg | ORAL_TABLET | Freq: Every day | ORAL | Status: AC

## 2015-01-04 MED ORDER — LOSARTAN POTASSIUM 50 MG PO TABS: 50.0000 mg | ORAL_TABLET | Freq: Every morning | ORAL | Status: AC

## 2015-01-04 NOTE — Telephone Encounter (Signed)
Spoke with tracy, aware we spoke at length with the patient. She can not remember ever taking the toprol or what kind of reaction she may have had. We will try the 12.5 mg dosage and watch the patient for any reactions.

## 2015-01-04 NOTE — Assessment & Plan Note (Signed)
Continue aspirin. Conservative measures given age and overall medical condition.

## 2015-01-04 NOTE — Assessment & Plan Note (Signed)
Continue aspirin 

## 2015-01-04 NOTE — Telephone Encounter (Signed)
Medication concern

## 2015-01-04 NOTE — Telephone Encounter (Signed)
[name] called in stating that Dr. Jens Somrenshaw wanted to decreased her cozaar and start her on toprol XR. [name] stated that the pt has an allergic reaction to metoprolol and wanted to find a different medication. Please call  Thanks

## 2015-01-04 NOTE — Assessment & Plan Note (Signed)
Patient remains in permanent atrial fibrillation. She is not a candidate for Coumadin given recurrent falls. Continue aspirin. Her weight is elevated. Her allergy list metoprolol but she does not recall this. I will try Toprol 12.5 mg daily to see if she tolerates.

## 2015-01-04 NOTE — Patient Instructions (Signed)
Your physician wants you to follow-up in: 6 MONTHS WITH DR Jens SomRENSHAW You will receive a reminder letter in the mail two months in advance. If you don't receive a letter, please call our office to schedule the follow-up appointment.   DECREASE LOSARTAN TO 50 MG ONCE DAILY  START METOPROLOL SUCC ER 12.5 MG ONCE DAILY AT BEDTIME= 1/2 OF A 25 MG TABLET ONCE DAILY AT BEDTIME

## 2015-01-04 NOTE — Assessment & Plan Note (Signed)
Given the addition of low-dose Toprol I will decrease Cozaar to 50 mg daily.

## 2015-01-04 NOTE — Assessment & Plan Note (Signed)
Continue present dose of Lasix. Potassium and renal function monitored by primary care. 

## 2015-01-06 ENCOUNTER — Encounter: Payer: Self-pay | Admitting: Cardiology

## 2015-04-12 ENCOUNTER — Ambulatory Visit: Payer: BLUE CROSS/BLUE SHIELD

## 2015-04-15 ENCOUNTER — Ambulatory Visit (INDEPENDENT_AMBULATORY_CARE_PROVIDER_SITE_OTHER): Payer: Medicare Other | Admitting: Podiatry

## 2015-04-15 ENCOUNTER — Encounter: Payer: Self-pay | Admitting: Podiatry

## 2015-04-15 VITALS — BP 148/117 | HR 98 | Resp 16

## 2015-04-15 DIAGNOSIS — M79673 Pain in unspecified foot: Secondary | ICD-10-CM

## 2015-04-15 DIAGNOSIS — B351 Tinea unguium: Secondary | ICD-10-CM | POA: Diagnosis not present

## 2015-04-15 NOTE — Progress Notes (Signed)
Patient presents to the office today with a chief complaint of painful elongated toenails.  Objective: Pulses are palpable bilateral. Nails are thick yellow dystrophic clinically mycotic and painful palpation.  Assessment: Pain in limb secondary to onychomycosis 1 through 5 bilateral.  Plan: Debridement of nails 1 through 5 bilateral covered service secondary to pain.  

## 2015-07-22 ENCOUNTER — Encounter: Payer: Self-pay | Admitting: Podiatry

## 2015-07-22 ENCOUNTER — Ambulatory Visit (INDEPENDENT_AMBULATORY_CARE_PROVIDER_SITE_OTHER): Payer: Medicare Other | Admitting: Podiatry

## 2015-07-22 DIAGNOSIS — M79673 Pain in unspecified foot: Secondary | ICD-10-CM | POA: Diagnosis not present

## 2015-07-22 DIAGNOSIS — B351 Tinea unguium: Secondary | ICD-10-CM

## 2015-07-22 NOTE — Progress Notes (Signed)
Patient ID: [name], female   DOB: [birth year], 81 y.o.   MRN: [id number] Complaint:  Visit Type: Patient returns to my office for continued preventative foot care services. Complaint: Patient states" my nails have grown long and thick and become painful to walk and wear shoes" . The patient presents for preventative foot care services. No changes to ROS. Patient diagnosed with PVD  Podiatric Exam: Vascular: dorsalis pedis and posterior tibial pulses are not  palpable bilateral due to swelling in both feet. Capillary return is immediate. Cold feet noted. Sensorium: Normal Semmes Weinstein monofilament test. Normal tactile sensation bilaterally. Nail Exam: Pt has thick disfigured discolored nails with subungual debris noted bilateral entire nail hallux  Ulcer Exam: There is no evidence of ulcer or pre-ulcerative changes or infection. Orthopedic Exam: Muscle tone and strength are WNL. No limitations in general ROM. No crepitus or effusions noted. Foot type and digits show no abnormalities. Bony prominences are unremarkable. Skin: No Porokeratosis. No infection or ulcers  Diagnosis:  Onychomycosis, , Pain in right toe, pain in left toes  Treatment & Plan Procedures and Treatment: Consent by patient was obtained for treatment procedures. The patient understood the discussion of treatment and procedures well. All questions were answered thoroughly reviewed. Debridement of mycotic and hypertrophic toenails, 1 through 5 bilateral and clearing of subungual debris. No ulceration, no infection noted.  Return Visit-Office Procedure: Patient instructed to return to the office for a follow up visit 3 months for continued evaluation and treatment.

## 2015-10-28 ENCOUNTER — Ambulatory Visit: Payer: Medicare Other | Admitting: Podiatry

## 2015-10-28 DEATH — deceased
